# Patient Record
Sex: Female | Born: 1935 | Race: White | Hispanic: No | Smoking: Former smoker
Health system: Southern US, Community
[De-identification: ages and names within clinical notes are randomized; demographics above are authoritative.]

## PROBLEM LIST (undated history)

## (undated) DIAGNOSIS — Z950 Presence of cardiac pacemaker: Secondary | ICD-10-CM

## (undated) DIAGNOSIS — I441 Atrioventricular block, second degree: Secondary | ICD-10-CM

## (undated) DIAGNOSIS — IMO0002 Reserved for concepts with insufficient information to code with codable children: Secondary | ICD-10-CM

## (undated) DIAGNOSIS — H8110 Benign paroxysmal vertigo, unspecified ear: Secondary | ICD-10-CM

## (undated) DIAGNOSIS — E119 Type 2 diabetes mellitus without complications: Secondary | ICD-10-CM

## (undated) DIAGNOSIS — R9430 Abnormal result of cardiovascular function study, unspecified: Secondary | ICD-10-CM

## (undated) DIAGNOSIS — R0989 Other specified symptoms and signs involving the circulatory and respiratory systems: Secondary | ICD-10-CM

## (undated) DIAGNOSIS — F0390 Unspecified dementia without behavioral disturbance: Secondary | ICD-10-CM

## (undated) DIAGNOSIS — I272 Pulmonary hypertension, unspecified: Secondary | ICD-10-CM

## (undated) DIAGNOSIS — N390 Urinary tract infection, site not specified: Secondary | ICD-10-CM

## (undated) DIAGNOSIS — R0602 Shortness of breath: Secondary | ICD-10-CM

## (undated) DIAGNOSIS — I10 Essential (primary) hypertension: Secondary | ICD-10-CM

## (undated) DIAGNOSIS — Z8619 Personal history of other infectious and parasitic diseases: Secondary | ICD-10-CM

## (undated) DIAGNOSIS — F22 Delusional disorders: Secondary | ICD-10-CM

## (undated) DIAGNOSIS — R55 Syncope and collapse: Secondary | ICD-10-CM

## (undated) DIAGNOSIS — I350 Nonrheumatic aortic (valve) stenosis: Secondary | ICD-10-CM

## (undated) DIAGNOSIS — E785 Hyperlipidemia, unspecified: Secondary | ICD-10-CM

## (undated) DIAGNOSIS — L299 Pruritus, unspecified: Secondary | ICD-10-CM

## (undated) DIAGNOSIS — J449 Chronic obstructive pulmonary disease, unspecified: Secondary | ICD-10-CM

## (undated) DIAGNOSIS — I2510 Atherosclerotic heart disease of native coronary artery without angina pectoris: Secondary | ICD-10-CM

## (undated) HISTORY — DX: Abnormal result of cardiovascular function study, unspecified: R94.30

## (undated) HISTORY — DX: Hyperlipidemia, unspecified: E78.5

## (undated) HISTORY — DX: Benign paroxysmal vertigo, unspecified ear: H81.10

## (undated) HISTORY — PX: CHOLECYSTECTOMY: SHX55

## (undated) HISTORY — DX: Atherosclerotic heart disease of native coronary artery without angina pectoris: I25.10

## (undated) HISTORY — DX: Reserved for concepts with insufficient information to code with codable children: IMO0002

## (undated) HISTORY — DX: Nonrheumatic aortic (valve) stenosis: I35.0

## (undated) HISTORY — DX: Atrioventricular block, second degree: I44.1

## (undated) HISTORY — PX: ABDOMINAL HYSTERECTOMY: SHX81

## (undated) HISTORY — DX: Personal history of other infectious and parasitic diseases: Z86.19

## (undated) HISTORY — PX: EYE SURGERY: SHX253

## (undated) HISTORY — DX: Chronic obstructive pulmonary disease, unspecified: J44.9

## (undated) HISTORY — PX: APPENDECTOMY: SHX54

## (undated) HISTORY — DX: Other specified symptoms and signs involving the circulatory and respiratory systems: R09.89

## (undated) HISTORY — DX: Pulmonary hypertension, unspecified: I27.20

---

## 1979-06-03 HISTORY — PX: TOTAL KNEE ARTHROPLASTY: SHX125

## 2004-09-05 ENCOUNTER — Ambulatory Visit: Payer: Self-pay | Admitting: Cardiology

## 2004-09-07 ENCOUNTER — Ambulatory Visit: Payer: Self-pay | Admitting: Cardiology

## 2005-11-10 ENCOUNTER — Ambulatory Visit: Payer: Self-pay | Admitting: Cardiology

## 2005-12-05 ENCOUNTER — Ambulatory Visit: Payer: Self-pay | Admitting: Cardiology

## 2009-09-05 ENCOUNTER — Ambulatory Visit: Payer: Self-pay | Admitting: Cardiology

## 2009-09-05 ENCOUNTER — Encounter: Payer: Self-pay | Admitting: Cardiology

## 2009-09-06 ENCOUNTER — Encounter: Payer: Self-pay | Admitting: Cardiology

## 2009-09-07 ENCOUNTER — Encounter: Payer: Self-pay | Admitting: Cardiology

## 2009-09-09 DIAGNOSIS — R55 Syncope and collapse: Secondary | ICD-10-CM | POA: Insufficient documentation

## 2009-09-09 DIAGNOSIS — E782 Mixed hyperlipidemia: Secondary | ICD-10-CM

## 2009-09-09 DIAGNOSIS — I10 Essential (primary) hypertension: Secondary | ICD-10-CM | POA: Insufficient documentation

## 2009-09-09 DIAGNOSIS — E119 Type 2 diabetes mellitus without complications: Secondary | ICD-10-CM

## 2009-09-09 HISTORY — DX: Syncope and collapse: R55

## 2009-09-10 ENCOUNTER — Encounter: Payer: Self-pay | Admitting: Cardiology

## 2009-10-05 ENCOUNTER — Ambulatory Visit: Payer: Self-pay | Admitting: Cardiology

## 2009-10-07 ENCOUNTER — Encounter (INDEPENDENT_AMBULATORY_CARE_PROVIDER_SITE_OTHER): Payer: Self-pay | Admitting: *Deleted

## 2009-10-18 ENCOUNTER — Encounter: Payer: Self-pay | Admitting: Cardiology

## 2009-10-23 ENCOUNTER — Encounter: Payer: Self-pay | Admitting: Physician Assistant

## 2009-10-28 ENCOUNTER — Ambulatory Visit: Payer: Self-pay | Admitting: Cardiology

## 2009-10-29 ENCOUNTER — Encounter (INDEPENDENT_AMBULATORY_CARE_PROVIDER_SITE_OTHER): Payer: Self-pay | Admitting: *Deleted

## 2009-10-29 ENCOUNTER — Encounter: Payer: Self-pay | Admitting: Cardiology

## 2009-10-29 ENCOUNTER — Ambulatory Visit: Payer: Self-pay | Admitting: Cardiology

## 2009-10-29 ENCOUNTER — Inpatient Hospital Stay (HOSPITAL_COMMUNITY): Admission: EM | Admit: 2009-10-29 | Discharge: 2009-11-02 | Payer: Self-pay | Admitting: Emergency Medicine

## 2009-10-31 ENCOUNTER — Encounter: Payer: Self-pay | Admitting: Internal Medicine

## 2009-11-02 ENCOUNTER — Encounter: Payer: Self-pay | Admitting: Internal Medicine

## 2009-11-02 HISTORY — PX: INSERT / REPLACE / REMOVE PACEMAKER: SUR710

## 2009-11-04 ENCOUNTER — Encounter: Payer: Self-pay | Admitting: Internal Medicine

## 2009-11-04 ENCOUNTER — Telehealth (INDEPENDENT_AMBULATORY_CARE_PROVIDER_SITE_OTHER): Payer: Self-pay | Admitting: *Deleted

## 2009-11-08 ENCOUNTER — Encounter: Payer: Self-pay | Admitting: Internal Medicine

## 2009-11-15 ENCOUNTER — Encounter: Payer: Self-pay | Admitting: Cardiology

## 2009-11-17 ENCOUNTER — Encounter: Payer: Self-pay | Admitting: Internal Medicine

## 2009-11-17 ENCOUNTER — Ambulatory Visit: Payer: Self-pay

## 2009-11-19 ENCOUNTER — Telehealth (INDEPENDENT_AMBULATORY_CARE_PROVIDER_SITE_OTHER): Payer: Self-pay | Admitting: *Deleted

## 2009-11-30 ENCOUNTER — Encounter: Payer: Self-pay | Admitting: Cardiology

## 2009-12-08 ENCOUNTER — Encounter: Payer: Self-pay | Admitting: Internal Medicine

## 2010-01-13 ENCOUNTER — Encounter: Payer: Self-pay | Admitting: Cardiology

## 2010-02-14 ENCOUNTER — Encounter: Payer: Self-pay | Admitting: Internal Medicine

## 2010-02-25 ENCOUNTER — Ambulatory Visit: Payer: Self-pay | Admitting: Internal Medicine

## 2010-07-04 ENCOUNTER — Encounter: Payer: Self-pay | Admitting: Cardiology

## 2010-10-14 ENCOUNTER — Inpatient Hospital Stay (HOSPITAL_COMMUNITY): Admission: EM | Admit: 2010-10-14 | Discharge: 2010-10-16 | Payer: Self-pay | Source: Home / Self Care

## 2010-10-14 ENCOUNTER — Encounter: Payer: Self-pay | Admitting: Internal Medicine

## 2010-10-17 ENCOUNTER — Encounter (INDEPENDENT_AMBULATORY_CARE_PROVIDER_SITE_OTHER): Payer: Self-pay | Admitting: *Deleted

## 2010-10-17 LAB — CBC
HCT: 43.6 % (ref 36.0–46.0)
Hemoglobin: 14.4 g/dL (ref 12.0–15.0)
MCH: 29 pg (ref 26.0–34.0)
MCHC: 33 g/dL (ref 30.0–36.0)
MCV: 87.7 fL (ref 78.0–100.0)
Platelets: 216 10*3/uL (ref 150–400)
RBC: 4.97 MIL/uL (ref 3.87–5.11)
RDW: 12.9 % (ref 11.5–15.5)
WBC: 10 10*3/uL (ref 4.0–10.5)

## 2010-10-17 LAB — DIFFERENTIAL
Basophils Absolute: 0 10*3/uL (ref 0.0–0.1)
Basophils Relative: 0 % (ref 0–1)
Eosinophils Absolute: 0.3 10*3/uL (ref 0.0–0.7)
Eosinophils Relative: 3 % (ref 0–5)
Lymphocytes Relative: 18 % (ref 12–46)
Lymphs Abs: 1.8 10*3/uL (ref 0.7–4.0)
Monocytes Absolute: 1 10*3/uL (ref 0.1–1.0)
Monocytes Relative: 10 % (ref 3–12)
Neutro Abs: 6.9 10*3/uL (ref 1.7–7.7)
Neutrophils Relative %: 69 % (ref 43–77)

## 2010-10-17 LAB — URINALYSIS, ROUTINE W REFLEX MICROSCOPIC
Bilirubin Urine: NEGATIVE
Hgb urine dipstick: NEGATIVE
Ketones, ur: NEGATIVE mg/dL
Nitrite: NEGATIVE
Protein, ur: NEGATIVE mg/dL
Specific Gravity, Urine: >1.03 — ABNORMAL HIGH (ref 1.005–1.030)
Urine Glucose, Fasting: NEGATIVE mg/dL
Urobilinogen, UA: 0.2 mg/dL (ref 0.0–1.0)
pH: 5.5 (ref 5.0–8.0)

## 2010-10-17 LAB — TSH: TSH: 3.373 u[IU]/mL (ref 0.350–4.500)

## 2010-10-17 LAB — CARDIAC PANEL(CRET KIN+CKTOT+MB+TROPI)
CK, MB: 1.4 ng/mL (ref 0.3–4.0)
CK, MB: 1.3 ng/mL (ref 0.3–4.0)
CK, MB: 1.3 ng/mL (ref 0.3–4.0)
Relative Index: INVALID (ref 0.0–2.5)
Relative Index: INVALID (ref 0.0–2.5)
Relative Index: INVALID (ref 0.0–2.5)
Total CK: 40 U/L (ref 7–177)
Total CK: 39 U/L (ref 7–177)
Total CK: 30 U/L (ref 7–177)
Troponin I: 0.02 ng/mL (ref 0.00–0.06)
Troponin I: 0.01 ng/mL (ref 0.00–0.06)
Troponin I: 0.02 ng/mL (ref 0.00–0.06)

## 2010-10-17 LAB — GLUCOSE, CAPILLARY
Glucose-Capillary: 100 mg/dL — ABNORMAL HIGH (ref 70–99)
Glucose-Capillary: 186 mg/dL — ABNORMAL HIGH (ref 70–99)
Glucose-Capillary: 188 mg/dL — ABNORMAL HIGH (ref 70–99)
Glucose-Capillary: 208 mg/dL — ABNORMAL HIGH (ref 70–99)
Glucose-Capillary: 171 mg/dL — ABNORMAL HIGH (ref 70–99)
Glucose-Capillary: 215 mg/dL — ABNORMAL HIGH (ref 70–99)
Glucose-Capillary: 138 mg/dL — ABNORMAL HIGH (ref 70–99)

## 2010-10-17 LAB — BASIC METABOLIC PANEL
BUN: 19 mg/dL (ref 6–23)
CO2: 31 mEq/L (ref 19–32)
Calcium: 9.9 mg/dL (ref 8.4–10.5)
Chloride: 103 mEq/L (ref 96–112)
Creatinine, Ser: 0.96 mg/dL (ref 0.4–1.2)
GFR calc Af Amer: >60 mL/min (ref >60)
GFR calc non Af Amer: 56 mL/min — ABNORMAL LOW (ref >60)
Glucose, Bld: 170 mg/dL — ABNORMAL HIGH (ref 70–99)
Potassium: 4.2 mEq/L (ref 3.5–5.1)
Sodium: 140 mEq/L (ref 135–145)

## 2010-10-17 LAB — POCT CARDIAC MARKERS
CKMB, poc: 1.2 ng/mL (ref 1.0–8.0)
Myoglobin, poc: 85.6 ng/mL (ref 12–200)
Troponin i, poc: <0.05 ng/mL (ref 0.00–0.09)

## 2010-10-27 NOTE — H&P (Addendum)
[name], [name]               ACCOUNT NO.:  [id number]  MEDICAL RECORD NO.:  [id number]          PATIENT TYPE:  EMS  LOCATION:  ED                            FACILITY:  [facility]  PHYSICIAN:  Rock Nephew, MD       DATE OF BIRTH:  06-26-1927  DATE OF ADMISSION:  10/14/2010 DATE OF DISCHARGE:  LH                             HISTORY & PHYSICAL   PRIMARY CARE PHYSICIAN:  The patient's had a primary care physician, Dr. Raphael Gibney.  However, the patient no longer sees a primary care physician any more and she says that she does not have a primary care physician.  CHIEF COMPLAINT:  Lightheadedness, dizziness.  HISTORY OF PRESENT ILLNESS:  An 77 year old female with a past medical history of COPD not on home oxygen, history of hypertension, history of diabetes, history of benign paroxysmal positional vertigo.  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 Johney Frame.  The patient reports that she has been have feeling lightheaded.  She usually has benign paroxysmal vertigo and she walks with the walker.  She is a poor historian and cannot really tell me is the room spinning or not.  Otherwise she denies any chest pain or any shortness of breath.  She denies any stroke-like activity. She denies any seizure-like activity.  He she denies any fevers or chills.  The patient had a CT scan of the head without contrast in the emergency department showed no acute intracranial abnormalities.  The patient also a 12-lead EKG which showed left axis deviation and right bundle branch block, atrially paced.  PAST MEDICAL HISTORY:  COPD not on home oxygen, history of hypertension, diabetes, history of benign paroxysmal positional vertigo, history of hypertension, hyperlipidemia, diabetes, osteoporosis, remote history of polio, remote history of tobacco abuse, history of herpes  zoster.  ALLERGIES:  No known drug allergies.  HOME MEDICATION: 1. She takes Diovan 320 mg once a day. 2. She takes the she also reports taking four aspirins a day, 2 in the     morning and 2 in the evening.  Unclear what dose he is taking. 3. She also takes amlodipine 5 mg p.o. daily.  REVIEW OF SYSTEMS:  She denies any headaches or blurry vision.  She denies any chest pain, shortness of breath.  She denies any nausea, vomiting, abdominal pain, constipation, diarrhea, burning on urination and pain in her legs.  She reports that her dizziness is worse when she walks?.  SOCIAL HISTORY:  She is a nonsmoker, nondrinker.  No drug abuse.  She walks with a walker.  She lives home with her son, [name].  Phone number [phone].  However, he is not home very often, per her.  PHYSICAL EXAMINATION:  VITAL SIGNS:  Are as follows.  Temperature 98.0, blood pressure 163/77, pulse rate 63, respiratory rate 20, 98% saturation room air. HEAD, EYES, EARS, NOSE AND THROAT:  Normocephalic, atraumatic.  Pupils equally round, reactive to light. CARDIOVASCULAR:  S1-S2 regular rate rhythm.  No murmurs or rubs.  LUNGS:  Clear to auscultation bilaterally.  No wheezes or rhonchi. ABDOMEN:  Soft, nontender, nondistended.  Bowel sounds positive.  No guarding or rebound tenderness. EXTREMITIES:  No lower extremity edema.  Cranial nerves II-XII grossly intact.  DIAGNOSTIC STUDIES:  A 12-lead EKG shows atrial pacemaker.  Nonspecific ST and T-wave changes, mild right bundle branch block.  RADIOLOGICAL STUDIES:  She had a CT scan of the head which showed no acute intracranial abnormality, mild age-appropriate generalized atrophy and severe chronic microvascular ischemic changes of the white matter diffusely.  She had a chest x-ray which showed hyperexpansion 9 mm nodule opacity in left lower lobe which is unchanged compared to February 2011.  LABORATORY STUDIES:  WBC count is 10.0, hemoglobin is 14.4,  hematocrit is 43.6, MCV is 87.7, platelets are 216,000.  Sodium is 140, potassium 4.2, chloride 103, bicarbonate 31, BUN 19, creatinine 0.96, glucose 170, troponin-I point-of-care marker less than 0.05.  Urinalysis specific gravity 1.030, negative leukocyte esterase, negative nitrites.  IMPRESSION AND PLAN:  An 77 year old female multiple medical problems admitted for dizziness and mild lightheaded admitted because the emergency department felt unsafe discharge for the patient go home.  PROBLEM LIST: 1. Lightheadedness.  Could be of the chronic of benign paroxysmal     positional vertigo this patient has been having.  I see that her     prescription pills for meclizine have been empty.  However, the     patient will be admitted to telemetry bed.  She will have cardiac     enzymes cycled x3.  The patient will have PT/OT evaluation.  The     patient will be started on schedule meclizine. 2. Chronic obstructive pulmonary disease.  The patient will get     albuterol/Atrovent nebs. 3. History of hypertension.  The patient will be started on Diovan,     amlodipine, and p.r.n. hydralazine. 4. Type 2 diabetes mellitus.  The patient will be placed on sensitive     sliding scale.  She does not seem to take any medications for     diabetes at home. 5. History of benign paroxysmal positional vertigo.  The patient will     be against placed on 12.5 mg p.o. q.8 h scheduled meclizine. 6. History of dual-chamber pacemaker.  We will continue to monitor the     patient on telemetry . 7. Deep vein thrombosis prophylaxis.  The patient will placed on     Lovenox.  CODE STATUS:  The patient is a Full Code.  This patient will also get PT, OT evaluation during this admission.     Rock Nephew, MD     NH/MEDQ  D:  10/14/2010  T:  10/14/2010  Job:  [id number]  Electronically Signed by Rock Nephew MD on 10/27/2010 10:21:29 AM

## 2010-11-01 NOTE — Procedures (Signed)
Summary: Holter and Event/ [facility] END OF SERVICE SUMMARY REPORT  Holter and Event/ [facility] END OF SERVICE SUMMARY REPORT   Imported By: Dorise Hiss 11/30/2009 15:30:37  _____________________________________________________________________  External Attachment:    Type:   Image     Comment:   External Document

## 2010-11-01 NOTE — Letter (Signed)
Summary: Engineer, materials at [facility]  [address]   [place], [place] [address]   Phone: [phone]  Fax: [phone]        October 07, 2009 MRN: [id number]   [facility] [address] [place], [place]  [address]   Dear Ms. Leyland,  Your test ordered by Selena Batten has been reviewed by your physician (or physician assistant) and was found to be normal or stable. Your physician (or physician assistant) felt no changes were needed at this time.  _X__ Echocardiogram  ____ Cardiac Stress Test  ____ Lab Work  ____ Peripheral vascular study of arms, legs or neck  ____ CT scan or X-ray  ____ Lung or Breathing test  ____ Other:   Thank you.   Carlye Grippe LPN    Duane Boston, M.D., F.A.C.C. Thressa Sheller, M.D., F.A.C.C. Oneal Grout, M.D., F.A.C.C. Cheree Ditto, M.D., F.A.C.C. Daiva Nakayama, M.D., F.A.C.C. Kenney Houseman, M.D., F.A.C.C. Jeanne Ivan, PA-C

## 2010-11-01 NOTE — Cardiovascular Report (Signed)
Summary: [facility]/ INTERROGATION REPORT  [facility]/ INTERROGATION REPORT   Imported By: Dorise Hiss 03/03/2010 11:04:44  _____________________________________________________________________  External Attachment:    Type:   Image     Comment:   External Document

## 2010-11-01 NOTE — Assessment & Plan Note (Signed)
Summary: pc2   Visit Type:  Pacemaker check Primary Provider:  Vyas   History of Present Illness: The patient presents today for routine electrophysiology followup. She reports doing well since having her pacemaker implanted. The patient denies symptoms of palpitations, chest pain, shortness of breath, orthopnea, PND, lower extremity edema, dizziness, presyncope, syncope, or neurologic sequela. The patient is tolerating medications without difficulties and is otherwise without complaint today.   Preventive Screening-Counseling & Management  Alcohol-Tobacco     Smoking Status: quit     Year Quit: 1985  Current Medications (verified): 1)  Diovan 320 Mg Tabs (Valsartan) .... Take 1 Tablet By Mouth Once A Day 2)  Meclizine Hcl 25 Mg Tabs (Meclizine Hcl) .... Take 1/2 Tablet By Mouth Two Times A Day One One At Bedtime 3)  Lantus 100 Unit/ml Soln (Insulin Glargine) .... 30u Subcutaneously Two Times A Day 4)  Aspir-Trin 325 Mg Tbec (Aspirin) .... Take 1 Tablet By Mouth Once A Day At Bedtime As Needed  Allergies (verified): No Known Drug Allergies  Comments:  Nurse/Medical Assistant: The patient is currently on medications but does not know the name or dosage at this time. Instructed to contact our office with details. Will update medication list at that time.  Past History:  Past Medical History: History of COPD Syncope due to Mobitz II AV block, s/p PPM 11/02/09 hypertension hyperlipidemia benign positional vertigo history of shingles Diabetes mellitus Osteoporosis  Past Surgical History: Appendectomy s/p Medtronic PPM 11/02/09 by JA  Vital Signs:  Patient profile:   77 year old female Height:      63 inches Weight:      199 pounds Pulse rate:   88 / minute BP sitting:   128 / 70  (left arm) Cuff size:   large  Vitals Entered By: Carlye Grippe (Feb 25, 2010 1:55 PM)  Physical Exam  General:  Well developed, well nourished, in no acute distress. Head:  normocephalic  and atraumatic Eyes:  PERRLA/EOM intact; conjunctiva and lids normal. Mouth:  Teeth, gums and palate normal. Oral mucosa normal. Neck:  supple Chest Wall:  pacemaker pocket is well healed Lungs:  clear Heart:  RRR, no m/r/g Abdomen:  Bowel sounds positive; abdomen soft and non-tender without masses, organomegaly, or hernias noted. No hepatosplenomegaly. Pulses:  pulses normal in all 4 extremities Extremities:  No clubbing or cyanosis.  1+ BLE edema Neurologic:  Alert and oriented x 3.   PPM Specifications Following MD:  Hillis Range, MD     PPM Vendor:  Medtronic     PPM Model Number:  ADDRL1     PPM Serial Number:  [id number] PPM DOI:  11/02/2009     PPM Implanting MD:  Hillis Range, MD  Lead 1    Location: RA     DOI: 11/02/2009     Model #: 4098     Serial #: [id number]     Status: active Lead 2    Location: RV     DOI: 11/02/2009     Model #: 1948     Serial #: [id number]     Status: active  Magnet Response Rate:  BOL 85 ERI 65  Indications:  CHB   PPM Follow Up Remote Check?  No Battery Voltage:  2.80 V     Battery Est. Longevity:  14 YEARS     Pacer Dependent:  No       PPM Device Measurements Atrium  Amplitude: 2.8 mV, Impedance: 604 ohms, Threshold: 0.625 V at  0.4 msec Right Ventricle  Amplitude: 22.4 mV, Impedance: 744 ohms, Threshold: 0.75 V at 0.4 msec  Episodes MS Episodes:  89     Percent Mode Switch:  <0.1%     Coumadin:  No Ventricular High Rate:  0     Atrial Pacing:  41.2%     Ventricular Pacing:  9.8%  Parameters Mode:  MVP (R)     Lower Rate Limit:  60     Upper Rate Limit:  130 Paced AV Delay:  250     Sensed AV Delay:  220 Next Cardiology Appt Due:  10/03/2010 Tech Comments:  Normal device function.  RA and RV autocapture already on.  Longest mode switch episode 52 seconds and is frequent PAC's.  No changes made today.  ROV 1-12 JA.   Will start Carelink transmissions at that time. Gypsy Balsam RN BSN  Feb 25, 2010 2:23 PM  MD Comments:   agree  Impression & Recommendations:  Problem # 1:  SYNCOPE (ICD-780.2) s/p PPM for Mobitz II  AV block doing well no changes today  Problem # 2:  HYPERTENSION (ICD-401.9) stable  Patient Instructions: 1)  return 2/12

## 2010-11-01 NOTE — Procedures (Signed)
Summary: Holter and Event/ URGENT REPORT  Holter and Event/ URGENT REPORT   Imported By: Dorise Hiss 11/30/2009 15:33:57  _____________________________________________________________________  External Attachment:    Type:   Image     Comment:   External Document

## 2010-11-01 NOTE — Letter (Signed)
Summary: Appointment -missed  [facility] at [facility] [address]   [place], [place] [address]   Phone: [phone]  Fax: [phone]     October 18, 2009 MRN: [id number]      [facility] [address] [place], [place]  [address]      Dear Ms. Deblois,  Our records indicate you missed your appointment on October 18, 2009                        with Dr.  Andee Lineman.   It is very important that we reach you to reschedule this appointment. We look forward to participating in your health care needs.   Please contact us at the number listed above at your earliest convenience to reschedule this appointment.   Sincerely,    Glass blower/designer

## 2010-11-01 NOTE — Progress Notes (Signed)
Summary: Question regarding stool softner  Phone Note Other Incoming Call back at [phone]   Caller: Kathlene November with [facility] Summary of Call: Kathlene November, physical therapist with [facility], called regarding [name]. He sates she has not had a bowel movement since last Friday. She would like to know what she can take over-the-counter for constipation with her other medications. She recently had pacer placed at [facility]. Kathlene November can be reached at [phone] or the family can be reached at [phone]. Initial call taken by: Cyril Loosen, RN, BSN,  November 04, 2009 3:48 PM  Follow-up for Phone Call        discussed with Dr Johney Frame suggested Senokot-S.  She walked and cleared per Kathlene November but he will let the family know. Dennis Bast, RN, BSN  November 05, 2009 2:18 PM

## 2010-11-01 NOTE — Miscellaneous (Signed)
Summary: [facility] Orders   [facility] Orders   Imported By: Roderic Ovens 12/10/2009 15:14:44  _____________________________________________________________________  External Attachment:    Type:   Image     Comment:   External Document

## 2010-11-01 NOTE — Procedures (Signed)
Summary: Urgent Cardionet Report  Urgent Cardionet Report   Imported By: Cyril Loosen, RN, BSN 10/25/2009 15:10:41  _____________________________________________________________________  External Attachment:    Type:   Image     Comment:   External Document

## 2010-11-01 NOTE — Miscellaneous (Signed)
Summary: Home Care Report/ [facility]  Home Care Report/ [facility]   Imported By: Dorise Hiss 02/21/2010 10:57:15  _____________________________________________________________________  External Attachment:    Type:   Image     Comment:   External Document

## 2010-11-01 NOTE — Procedures (Signed)
Summary: wound check   Current Medications (verified): 1)  Diovan 320 Mg Tabs (Valsartan) .... Take 1 Tablet By Mouth Once A Day 2)  Meclizine Hcl 25 Mg Tabs (Meclizine Hcl) .... Take 1/2 Tablet By Mouth Two Times A Day One One At Bedtime 3)  Lantus 100 Unit/ml Soln (Insulin Glargine) .... 30u Subcutaneously Two Times A Day 4)  Aspir-Trin 325 Mg Tbec (Aspirin) .... Take 1 Tablet By Mouth Once A Day At Bedtime As Needed  Allergies (verified): No Known Drug Allergies  PPM Specifications Following MD:  Hillis Range, MD     PPM Vendor:  Medtronic     PPM Model Number:  ADDRL1     PPM Serial Number:  [id number] PPM DOI:  11/02/2009     PPM Implanting MD:  Hillis Range, MD  Lead 1    Location: RA     DOI: 11/02/2009     Model #: 4098     Serial #: [id number]     Status: active Lead 2    Location: RV     DOI: 11/02/2009     Model #: 1948     Serial #: [id number]     Status: active  Magnet Response Rate:  BOL 85 ERI 65  Indications:  CHB   PPM Follow Up Remote Check?  No Battery Voltage:  2.80 V     Battery Est. Longevity:  8 years     Pacer Dependent:  No       PPM Device Measurements Atrium  Amplitude: 2.8 mV, Impedance: 626 ohms, Threshold: 0.75 V at 0.4 msec Right Ventricle  Amplitude: 15.68 mV, Impedance: 683 ohms, Threshold: 0.75 V at 0.4 msec  Episodes MS Episodes:  10     Percent Mode Switch:  0.1%     Coumadin:  No Ventricular High Rate:  0     Atrial Pacing:  12.5%     Ventricular Pacing:  99.7%  Parameters Mode:  MVP (R)     Lower Rate Limit:  60     Upper Rate Limit:  130 Paced AV Delay:  250     Sensed AV Delay:  220 Next Cardiology Appt Due:  01/31/2010 Tech Comments:  Wound check appt.  Steri-strips removed.  Wound without redness or edema.  Pt was still wearing sling, advised this was no longer needed.  Device was implanted for Mobitz II with intermittent CHB.  Pt in NSR today with a PR interval of around .  Device reprogrammed to MVP today to allow for  intrinsic conduction and still allow shorter AV delays when V pacing is required during periods of heart block.  No other changes made.  All mode switch episodes less than 30 seconds so no EGM's available.  Pt education on wound care completed.  ROV 3 months Dr Johney Frame in [place] office. Gypsy Balsam RN BSN  November 19, 2009 2:09 PM

## 2010-11-01 NOTE — Miscellaneous (Signed)
Summary: Home Health Certification/Care Plan  Home Health Certification/Care Plan   Imported By: Roderic Ovens 12/16/2009 14:07:34  _____________________________________________________________________  External Attachment:    Type:   Image     Comment:   External Document

## 2010-11-01 NOTE — Progress Notes (Signed)
Summary: NEED VERBAL ORDER TO EXTEND SERVICES  Phone Note From Other Clinic Call back at [phone]   Caller: Micheal Call For: Dr. Jens Som Summary of Call: Physical Therapist called from [facility] requesting a verbal order that services be extended two more weeks since patient hasn't met goal yet. Nurse informed that this would be okay and to have request sent over. Initial call taken by: Carlye Grippe,  November 19, 2009 4:39 PM  Follow-up for Phone Call        ok to continue Ferman Hamming, MD, [facility]  November 19, 2009 5:23 PM

## 2010-11-01 NOTE — Cardiovascular Report (Signed)
Summary: Office Visit  Office Visit   Imported By: Roderic Ovens 11/25/2009 15:49:57  _____________________________________________________________________  External Attachment:    Type:   Image     Comment:   External Document

## 2010-11-01 NOTE — Letter (Signed)
Summary: [facility] D/C SUMMARY DR.VYAS  [facility] D/C SUMMARY DR.VYAS   Imported By: Zachary George 10/04/2009 14:33:16  _____________________________________________________________________  External Attachment:    Type:   Image     Comment:   External Document

## 2010-11-01 NOTE — Miscellaneous (Signed)
Summary: Device preload  Clinical Lists Changes  Observations: Added new observation of PPM INDICATN: CHB (11/08/2009 14:59) Added new observation of MAGNET RTE: BOL 85 ERI 65 (11/08/2009 14:59) Added new observation of PPMLEADSTAT2: active (11/08/2009 14:59) Added new observation of PPMLEADSER2: [id number] (11/08/2009 14:59) Added new observation of PPMLEADMOD2: 1948  (11/08/2009 14:59) Added new observation of PPMLEADLOC2: RV  (11/08/2009 14:59) Added new observation of PPMLEADSTAT1: active  (11/08/2009 14:59) Added new observation of PPMLEADSER1: [id number]  (11/08/2009 14:59) Added new observation of PPMLEADMOD1: 5076  (11/08/2009 14:59) Added new observation of PPMLEADLOC1: RA  (11/08/2009 14:59) Added new observation of PPM IMP MD: Hillis Range, MD  (11/08/2009 14:59) Added new observation of PPMLEADDOI2: 11/02/2009  (11/08/2009 14:59) Added new observation of PPMLEADDOI1: 11/02/2009  (11/08/2009 14:59) Added new observation of PPM DOI: 11/02/2009  (11/08/2009 14:59) Added new observation of PPM SERL#: [id number]  (11/08/2009 14:59) Added new observation of PPM MODL#: ADDRL1  (11/08/2009 95:63) Added new observation of PACEMAKERMFG: Medtronic  (11/08/2009 14:59) Added new observation of PACEMAKER MD: Hillis Range, MD  (11/08/2009 14:59)      PPM Specifications Following MD:  Hillis Range, MD     PPM Vendor:  Medtronic     PPM Model Number:  ADDRL1     PPM Serial Number:  [id number] PPM DOI:  11/02/2009     PPM Implanting MD:  Hillis Range, MD  Lead 1    Location: RA     DOI: 11/02/2009     Model #: 5188     Serial #: [id number]     Status: active Lead 2    Location: RV     DOI: 11/02/2009     Model #: 1948     Serial #: [id number]     Status: active  Magnet Response Rate:  BOL 85 ERI 65  Indications:  CHB

## 2010-11-01 NOTE — Letter (Signed)
Summary: ER Notification  Architectural technologist at [facility]. [address]   [place], [place] [address]   Phone: [phone]  Fax: [phone]    October 29, 2009 4:33 PM  [facility]  The above referenced patient has been advised to report directly to the Emergency Room. Please see below for more information:  Dx: __Heart Block with pauses, h/o syncope__________     Private Vehicle  _______________ or EMS:  ________________   Orders:  Yes ______ or No  _______   Notify upon arrival:     Trish [phone]) [phone] (at [facility])       Or _________________   Thank you,    [facility] Staff   Pt aware to go to ER ASAP for eval. Pt is trying to arrange transportation to [facility]. If she cannot arrange transportation to [facility], she will call EMS and most likely go to [facility] ER for initial evaluation.

## 2010-11-01 NOTE — Assessment & Plan Note (Signed)
Summary: PER DR.KATZ   Visit Type:  hospital Follow-up Primary Provider:  Vyas  CC:  hospital follow-up visit.  History of Present Illness: Pleasant 77 year old female recently admitted to that hospital with a syncopal episode. This was felt to be secondary to orthostasis. Her triamterene hydrochlorothiazide was discontinued. Note her enzymes were negative. An electrocardiogram apparently showed sinus rhythm with first degree AV block, right bundle branch block and left anterior fascicular block. An echocardiogram on September 10, 2009 revealed normal LV function, moderate LVH. It was interpreted as very mild aortic stenosis with a mean gradient was 6 mmHg not consistent with significant aortic stenosis.  A Myoview was performed on December 10 that showed an ejection fraction of 75%. There was inferior attenuation but no ischemia. Since she was discharged she continues to complain of occasional dizzy spells. She states that these can occur when standing up and improved with sitting down. However they also occur while sitting and lying. She will suddenly feel lightheaded like she is going to pass out. This lasts for seconds to minutes and resolve spontaneously. There is no associated chest pain or shortness of breath. She's had no syncopal episodes and since she went home. She does occasionally feel a sharp pain in her chest that lasts seconds. However she does not have exertional chest pain.  Preventive Screening-Counseling & Management  Alcohol-Tobacco     Smoking Status: quit     Year Quit: 1972  Current Medications (verified): 1)  Diovan 320 Mg Tabs (Valsartan) .... Take 1 Tablet By Mouth Once A Day 2)  Meclizine Hcl 25 Mg Tabs (Meclizine Hcl) .... Take 1/2 Tablet By Mouth Two Times A Day One One At Bedtime 3)  Lantus 100 Unit/ml Soln (Insulin Glargine) .... 30u Subcutaneously Two Times A Day 4)  Aspir-Trin 325 Mg Tbec (Aspirin) .... Take 1 Tablet By Mouth Once A Day At Bedtime As  Needed  Allergies (verified): No Known Drug Allergies  Comments:  Nurse/Medical Assistant: The patient's medications and allergies were reviewed with the patient and were updated in the Medication and Allergy Lists. Patient brought bottles to office.  Past History:  Past Medical History: History of COPD Syncope hypertension hyperlipidemia benign positional vertigo history of shingles Diabetes mellitus Osteoporosis  Past Surgical History: Appendectomy  Social History: Reviewed history from 09/09/2009 and no changes required. Retired  Alcohol Use - no Drug Use - no former history of smoking approx. for 30 years Smoking Status:  quit  Review of Systems       Problems with dizziness and fatigue but no fevers or chills, productive cough, hemoptysis, dysphasia, odynophagia, melena, hematochezia, dysuria, hematuria, rash, seizure activity, orthopnea, PND, pedal edema, claudication. Remaining systems are negative.   Vital Signs:  Patient profile:   77 year old female Height:      63 inches Weight:      194 pounds BMI:     34.49 O2 Sat:      95 % Pulse rate:   77 / minute BP sitting:   173 / 79  (left arm) Cuff size:   large  Vitals Entered By: Carlye Grippe (October 05, 2009 12:49 PM)  Nutrition Counseling: Patient's BMI is greater than 25 and therefore counseled on weight management options. CC: hospital follow-up visit   Physical Exam  General:  Well-developed well-nourished in no acute distress.  Skin is warm and dry.  HEENT is normal.  Neck is supple. No thyromegaly.  Chest is clear to auscultation with normal expansion.  Cardiovascular exam is regular rate and rhythm. 1/6 systolic murmur left sternal border. Abdominal exam nontender or distended. No masses palpated. Extremities show no edema. neuro grossly intact    Impression & Recommendations:  Problem # 1:  SYNCOPE (ICD-780.2)  The patient continues to have occasional dizzy spells. Some of her  symptoms sound to be orthostatic mediated as they occur with standing and resolve with sitting down. However she has transient episodes while sitting and lying. She also has a trifascicular block. I am concerned about the possibility of bradycardia mediated events. I will schedule an event monitor to more fully evaluate. She may require a pacemaker if indeed she has bradycardia associated with these episodes.  Note her LV function is normal by echo and her Myoview showed no ischemia. Note we did check orthostatics in the office. In the lying position her blood pressure was 172/92 with a pulse of 69. In the standing position she was 168/89 with a pulse of 89. She was therefore orthostatic with pulse only. Her updated medication list for this problem includes:    Aspir-trin 325 Mg Tbec (Aspirin) .Marland Kitchen... Take 1 tablet by mouth once a day at bedtime as needed  Orders: Cardionet/Event Monitor (Cardionet/Event)  Problem # 2:  HYPERTENSION (ICD-401.9) Her blood pressure is elevated today. However I am hesitant to add additional medications at this point as some of her symptoms sound orthostatic mediated. If her blood pressure remains elevated in the future and will most likely add a low dose calcium blocker such as Norvasc. I would like to avoid diuretics if possible given the orthostatic nature of her symptoms. Her updated medication list for this problem includes:    Diovan 320 Mg Tabs (Valsartan) .Marland Kitchen... Take 1 tablet by mouth once a day    Aspir-trin 325 Mg Tbec (Aspirin) .Marland Kitchen... Take 1 tablet by mouth once a day at bedtime as needed  Problem # 3:  CHEST PAIN (ICD-786.50) She has vague atypical chest pain. Recent Myoview without ischemia. No further workup at this point. Her updated medication list for this problem includes:    Aspir-trin 325 Mg Tbec (Aspirin) .Marland Kitchen... Take 1 tablet by mouth once a day at bedtime as needed  Problem # 4:  DM (ICD-250.00) Management per primary care. Her updated medication  list for this problem includes:    Diovan 320 Mg Tabs (Valsartan) .Marland Kitchen... Take 1 tablet by mouth once a day    Lantus 100 Unit/ml Soln (Insulin glargine) .Marland KitchenMarland KitchenMarland KitchenMarland Kitchen 30u subcutaneously two times a day    Aspir-trin 325 Mg Tbec (Aspirin) .Marland Kitchen... Take 1 tablet by mouth once a day at bedtime as needed  Problem # 5:  MIXED HYPERLIPIDEMIA (ICD-272.2) Management per primary care.  Patient Instructions: 1)  Your physician has recommended that you wear an event monitor.  Event monitors are medical devices that record the heart's electrical activity. Doctors most often use these monitors to diagnose arrhythmias. Arrhythmias are problems with the speed or rhythm of the heartbeat. The monitor is a small, portable device. You can wear one while you do your normal daily activities. This is usually used to diagnose what is causing palpitations/syncope (passing out). 2)  FOLLOW UP APPT WITH GENE SERPE, PA-C ON MONDAY, JANUARY 17TH AT 12:45PM.

## 2010-11-01 NOTE — Miscellaneous (Signed)
Summary: Home Health Certification/Care Plan   Home Health Certification/Care Plan   Imported By: Roderic Ovens 12/17/2009 10:44:47  _____________________________________________________________________  External Attachment:    Type:   Image     Comment:   External Document

## 2010-11-01 NOTE — Letter (Signed)
Summary: Appointment- Rescheduled  [facility] [address]   [place], [place] [address]   Phone: [phone]  Fax: [phone]     Feb 14, 2010 MRN: [id number]     [facility] [address] [place], [place]  [address]     Dear Ms. Topham,   Due to a change in our office schedule, your appointment on   MAY 20th, 2011 at 1:45 must be changed.    Your new appointment will be May 27th, 2011 at 1:45pm.  We look forward to participating in your health care needs.      Sincerely,  Glass blower/designer

## 2010-11-03 NOTE — Letter (Signed)
Summary: Appointment - Reminder 2  [facility], Main Office  [address]   [place], [place] [address]   Phone: [phone]  Fax: [phone]     October 17, 2010 MRN: [id number]   [facility] [address] [place], [place]  [address]   Dear Ms. Finamore,  Our records indicate that you need a 1 week hospital follow up appointment with Dr. Johney Frame. It is very important that we reach you to schedule this appointment. We look forward to participating in your health care needs. Please contact us at the number listed above at your earliest convenience to schedule your appointment.  If you are unable to make an appointment at this time, give [place] a call so we can update our records.     Sincerely,   Glass blower/designer

## 2010-11-17 NOTE — Letter (Signed)
Summary: [facility] Discharge Summary  [facility] Discharge Summary   Imported By: Earl Many 11/09/2010 16:59:51  _____________________________________________________________________  External Attachment:    Type:   Image     Comment:   External Document

## 2010-11-28 ENCOUNTER — Other Ambulatory Visit (HOSPITAL_COMMUNITY): Payer: Self-pay | Admitting: Family Medicine

## 2010-11-28 DIAGNOSIS — Z139 Encounter for screening, unspecified: Secondary | ICD-10-CM

## 2010-12-12 ENCOUNTER — Ambulatory Visit (HOSPITAL_COMMUNITY)
Admission: RE | Admit: 2010-12-12 | Discharge: 2010-12-12 | Disposition: A | Discharge status: Home / Self Care | Payer: Medicare Other | Source: Ambulatory Visit | Attending: Family Medicine | Admitting: Family Medicine

## 2010-12-12 DIAGNOSIS — Z1231 Encounter for screening mammogram for malignant neoplasm of breast: Secondary | ICD-10-CM | POA: Insufficient documentation

## 2010-12-12 DIAGNOSIS — Z139 Encounter for screening, unspecified: Secondary | ICD-10-CM

## 2010-12-19 LAB — GLUCOSE, CAPILLARY
Glucose-Capillary: 111 mg/dL — ABNORMAL HIGH (ref 70–99)
Glucose-Capillary: 119 mg/dL — ABNORMAL HIGH (ref 70–99)
Glucose-Capillary: 125 mg/dL — ABNORMAL HIGH (ref 70–99)
Glucose-Capillary: 144 mg/dL — ABNORMAL HIGH (ref 70–99)
Glucose-Capillary: 147 mg/dL — ABNORMAL HIGH (ref 70–99)
Glucose-Capillary: 168 mg/dL — ABNORMAL HIGH (ref 70–99)
Glucose-Capillary: 196 mg/dL — ABNORMAL HIGH (ref 70–99)

## 2010-12-19 LAB — CBC
Hemoglobin: 13.6 g/dL (ref 12.0–15.0)
MCHC: 33 g/dL (ref 30.0–36.0)
MCV: 89.5 fL (ref 78.0–100.0)
RBC: 4.59 MIL/uL (ref 3.87–5.11)

## 2010-12-19 LAB — BASIC METABOLIC PANEL
CO2: 30 mEq/L (ref 19–32)
CO2: 30 mEq/L (ref 19–32)
Chloride: 106 mEq/L (ref 96–112)
Chloride: 99 mEq/L (ref 96–112)
GFR calc Af Amer: >60 mL/min (ref >60)
Glucose, Bld: 136 mg/dL — ABNORMAL HIGH (ref 70–99)
Potassium: 4.2 mEq/L (ref 3.5–5.1)
Sodium: 136 mEq/L (ref 135–145)
Sodium: 140 mEq/L (ref 135–145)

## 2010-12-19 LAB — COMPREHENSIVE METABOLIC PANEL
ALT: 17 U/L (ref 0–35)
AST: 16 U/L (ref 0–37)
Albumin: 3.4 g/dL — ABNORMAL LOW (ref 3.5–5.2)
CO2: 29 mEq/L (ref 19–32)
Calcium: 9.7 mg/dL (ref 8.4–10.5)
GFR calc Af Amer: >60 mL/min (ref >60)
GFR calc non Af Amer: >60 mL/min (ref >60)
Sodium: 139 mEq/L (ref 135–145)

## 2010-12-19 LAB — DIFFERENTIAL
Basophils Relative: 1 % (ref 0–1)
Eosinophils Absolute: 0.4 10*3/uL (ref 0.0–0.7)
Monocytes Absolute: 0.9 10*3/uL (ref 0.1–1.0)
Monocytes Relative: 9 % (ref 3–12)

## 2010-12-19 LAB — POCT I-STAT, CHEM 8
Calcium, Ion: 1.27 mmol/L (ref 1.12–1.32)
Chloride: 108 mEq/L (ref 96–112)
Glucose, Bld: 186 mg/dL — ABNORMAL HIGH (ref 70–99)
HCT: 41 % (ref 36.0–46.0)
Hemoglobin: 13.9 g/dL (ref 12.0–15.0)

## 2010-12-19 LAB — TSH: TSH: 3.347 u[IU]/mL (ref 0.350–4.500)

## 2010-12-19 LAB — POCT CARDIAC MARKERS: Troponin i, poc: <0.05 ng/mL (ref 0.00–0.09)

## 2010-12-22 LAB — GLUCOSE, CAPILLARY
Glucose-Capillary: 193 mg/dL — ABNORMAL HIGH (ref 70–99)
Glucose-Capillary: 267 mg/dL — ABNORMAL HIGH (ref 70–99)

## 2011-03-30 ENCOUNTER — Encounter: Payer: Self-pay | Admitting: Internal Medicine

## 2011-05-01 ENCOUNTER — Emergency Department (HOSPITAL_COMMUNITY): Payer: Medicare Other

## 2011-05-01 ENCOUNTER — Other Ambulatory Visit: Payer: Self-pay

## 2011-05-01 ENCOUNTER — Encounter (HOSPITAL_COMMUNITY): Payer: Self-pay | Admitting: *Deleted

## 2011-05-01 ENCOUNTER — Inpatient Hospital Stay (HOSPITAL_COMMUNITY)
Admission: EM | Admit: 2011-05-01 | Discharge: 2011-05-04 | DRG: 312 | Disposition: A | Discharge status: Home Health | Payer: Medicare Other | Attending: Family Medicine | Admitting: Family Medicine

## 2011-05-01 DIAGNOSIS — F0390 Unspecified dementia without behavioral disturbance: Secondary | ICD-10-CM | POA: Diagnosis present

## 2011-05-01 DIAGNOSIS — Z950 Presence of cardiac pacemaker: Secondary | ICD-10-CM

## 2011-05-01 DIAGNOSIS — S20229A Contusion of unspecified back wall of thorax, initial encounter: Secondary | ICD-10-CM | POA: Diagnosis present

## 2011-05-01 DIAGNOSIS — I10 Essential (primary) hypertension: Secondary | ICD-10-CM | POA: Diagnosis present

## 2011-05-01 DIAGNOSIS — A498 Other bacterial infections of unspecified site: Secondary | ICD-10-CM | POA: Diagnosis present

## 2011-05-01 DIAGNOSIS — M47817 Spondylosis without myelopathy or radiculopathy, lumbosacral region: Secondary | ICD-10-CM | POA: Diagnosis present

## 2011-05-01 DIAGNOSIS — E119 Type 2 diabetes mellitus without complications: Secondary | ICD-10-CM | POA: Diagnosis present

## 2011-05-01 DIAGNOSIS — E785 Hyperlipidemia, unspecified: Secondary | ICD-10-CM | POA: Diagnosis present

## 2011-05-01 DIAGNOSIS — I2510 Atherosclerotic heart disease of native coronary artery without angina pectoris: Secondary | ICD-10-CM | POA: Diagnosis present

## 2011-05-01 DIAGNOSIS — W19XXXA Unspecified fall, initial encounter: Secondary | ICD-10-CM | POA: Diagnosis present

## 2011-05-01 DIAGNOSIS — R55 Syncope and collapse: Principal | ICD-10-CM | POA: Diagnosis present

## 2011-05-01 DIAGNOSIS — N390 Urinary tract infection, site not specified: Secondary | ICD-10-CM | POA: Diagnosis present

## 2011-05-01 HISTORY — DX: Presence of cardiac pacemaker: Z95.0

## 2011-05-01 LAB — URINALYSIS, ROUTINE W REFLEX MICROSCOPIC
Bilirubin Urine: NEGATIVE
Ketones, ur: NEGATIVE mg/dL
Nitrite: NEGATIVE
Protein, ur: 30 mg/dL — AB
pH: 6.5 (ref 5.0–8.0)

## 2011-05-01 LAB — GLUCOSE, CAPILLARY

## 2011-05-01 LAB — URINE MICROSCOPIC-ADD ON

## 2011-05-01 LAB — DIFFERENTIAL
Basophils Absolute: 0.1 10*3/uL (ref 0.0–0.1)
Basophils Relative: 1 % (ref 0–1)
Eosinophils Absolute: 0.3 10*3/uL (ref 0.0–0.7)
Monocytes Absolute: 0.8 10*3/uL (ref 0.1–1.0)
Monocytes Relative: 8 % (ref 3–12)
Neutrophils Relative %: 73 % (ref 43–77)

## 2011-05-01 LAB — CBC
Hemoglobin: 15.6 g/dL — ABNORMAL HIGH (ref 12.0–15.0)
MCH: 28.2 pg (ref 26.0–34.0)
MCHC: 32.6 g/dL (ref 30.0–36.0)
RDW: 13 % (ref 11.5–15.5)

## 2011-05-01 LAB — BASIC METABOLIC PANEL
BUN: 14 mg/dL (ref 6–23)
Creatinine, Ser: 0.74 mg/dL (ref 0.50–1.10)
GFR calc Af Amer: >60 mL/min (ref >60)
GFR calc non Af Amer: >60 mL/min (ref >60)
Potassium: 4.2 mEq/L (ref 3.5–5.1)

## 2011-05-01 LAB — CARDIAC PANEL(CRET KIN+CKTOT+MB+TROPI)
CK, MB: 2.5 ng/mL (ref 0.3–4.0)
Total CK: 60 U/L (ref 7–177)

## 2011-05-01 MED ORDER — INSULIN GLARGINE 100 UNIT/ML ~~LOC~~ SOLN
30.0000 [IU] | Freq: Two times a day (BID) | SUBCUTANEOUS | Status: DC
  Administered 2011-05-01 – 2011-05-04 (×6): 30 [IU] via SUBCUTANEOUS
  Filled 2011-05-01 (×2): qty 3

## 2011-05-01 MED ORDER — DEXTROSE 5 % IV SOLN
INTRAVENOUS | Status: AC
  Filled 2011-05-01: qty 1

## 2011-05-01 MED ORDER — SODIUM CHLORIDE 0.45 % IV SOLN
INTRAVENOUS | Status: DC
  Administered 2011-05-01: 18:00:00 via INTRAVENOUS
  Administered 2011-05-03: 1000 mL via INTRAVENOUS
  Administered 2011-05-04: 07:00:00 via INTRAVENOUS

## 2011-05-01 MED ORDER — DEXTROSE 5 % IV SOLN
1.0000 g | INTRAVENOUS | Status: DC
  Administered 2011-05-01: 13:00:00 via INTRAVENOUS
  Administered 2011-05-02 – 2011-05-03 (×2): 1 g via INTRAVENOUS
  Filled 2011-05-01 (×4): qty 1

## 2011-05-01 MED ORDER — ENOXAPARIN SODIUM 40 MG/0.4ML ~~LOC~~ SOLN
40.0000 mg | SUBCUTANEOUS | Status: DC
  Administered 2011-05-01 – 2011-05-03 (×3): 40 mg via SUBCUTANEOUS
  Filled 2011-05-01 (×2): qty 0.4

## 2011-05-01 MED ORDER — ONDANSETRON HCL 4 MG/2ML IJ SOLN
4.0000 mg | Freq: Four times a day (QID) | INTRAMUSCULAR | Status: DC | PRN
  Administered 2011-05-01 – 2011-05-03 (×2): 4 mg via INTRAVENOUS
  Filled 2011-05-01 (×2): qty 2

## 2011-05-01 MED ORDER — HYDROMORPHONE HCL 1 MG/ML IJ SOLN
1.0000 mg | Freq: Once | INTRAMUSCULAR | Status: AC
  Administered 2011-05-01: 1 mg via INTRAVENOUS
  Filled 2011-05-01: qty 1

## 2011-05-01 MED ORDER — SODIUM CHLORIDE 0.9 % IV SOLN: INTRAVENOUS | Status: DC

## 2011-05-01 MED ORDER — DEXTROSE 5 % IV SOLN: 1.0000 g | Freq: Once | INTRAVENOUS | Status: DC

## 2011-05-01 NOTE — ED Provider Notes (Addendum)
History    Scribed for Shelda Jakes, MD, the patient was seen in room APA02/APA02. This chart was scribed by Clarita Crane. This patient's care was started at 12:03 PM.  Chief Complaint  Patient presents with  . Syncopal episode    Patient is a 77 y.o. female presenting with syncope. The history is provided by the patient. No language interpreter was used.  Loss of Consciousness This is a recurrent problem. Episode onset: this morning. The problem has been resolved. Pertinent negatives include no chest pain, no abdominal pain, no headaches and no shortness of breath. The symptoms are aggravated by nothing. The symptoms are relieved by nothing. She has tried nothing for the symptoms.  Patient is an 77 year old female with an apparent syncopal episode this morning while at home alone and now complains of pain to gluteal region. Denies having chest pain, SOB, abdominal pain, fever, nausea or vomiting prior to and after episode.  Notes she was unable to get up from the ground after awaking from the episode due to the pain in her gluteal region. Patient is unsure whether she sustained a head injury with fall during syncopal episode. States she called EMS after awaking but EMS did not answer. Reports she had a syncopal episode 3 days ago while resting at home but was not evaluated after the episode. Patient with h/o pacemaker inserted 2 years ago but is unsure of the brand inserted.   Cardiologist-[facility] PCP-[facility]  PAST MEDICAL HISTORY:  Past Medical History  Diagnosis Date  . COPD (chronic obstructive pulmonary disease)   . Syncope and collapse     Due to Mobitz II AV block, s/p PPM 11/02/09  . Hypertension   . Hyperlipidemia   . Benign positional vertigo   . History of shingles   . Diabetes mellitus   . Osteoporosis   . Pacemaker     PAST SURGICAL HISTORY:  Past Surgical History  Procedure Date  . Appendectomy   . Insert / replace / remove pacemaker 11/02/09    S/p  [employer] PPM by JA  . Cholecystectomy   . Abdominal hysterectomy     MEDICATIONS:  Previous Medications   ASPIRIN EC 325 MG TABLET    Take 325 mg by mouth as needed. For pain   CIPROFLOXACIN (CIPRO) 500 MG TABLET    Take 500 mg by mouth 2 (two) times daily. Last dose would have been today    INSULIN GLARGINE (LANTUS) 100 UNIT/ML INJECTION    Inject 30 Units into the skin 2 (two) times daily.     MECLIZINE (ANTIVERT) 25 MG TABLET    Take 12.5-25 mg by mouth 3 (three) times daily. Takes 12.5 mg twice a day and takes 25 mg at bedtime.   VALSARTAN (DIOVAN) 320 MG TABLET    Take 320 mg by mouth daily.       ALLERGIES:  Allergies as of 05/01/2011  . (No Known Allergies)     FAMILY HISTORY:  Family History  Problem Relation Age of Onset  . Cancer Mother     Stomach  . Cancer Father     Stomach  . Heart disease Neg Hx     Nor sudden cardiac death  . Diabetes Other     Several family members     SOCIAL HISTORY: History   Social History  . Marital Status: Single    Spouse Name: N/A    Number of Children: N/A  . Years of Education: N/A  Occupational History  . Retired    Social History Main Topics  . Smoking status: Former Smoker -- 30 years  . Smokeless tobacco: None  . Alcohol Use: No  . Drug Use: No  . Sexually Active:    Other Topics Concern  . None   Social History Narrative  . None     Review of Systems  Constitutional: Negative for fever and chills.  HENT: Negative for rhinorrhea.   Eyes: Negative for pain.  Respiratory: Negative for cough and shortness of breath.   Cardiovascular: Positive for syncope. Negative for chest pain.  Gastrointestinal: Negative for nausea, vomiting, abdominal pain and diarrhea.  Genitourinary: Negative for dysuria.  Musculoskeletal: Negative for back pain.       Gluteal pain/soreness  Skin: Negative for rash.  Neurological: Positive for syncope. Negative for weakness and headaches.    Physical Exam  BP 209/86  Pulse  74  Temp(Src) 98 F (36.7 C) (Oral)  Resp 23  Ht 5\' 5"  (1.651 m)  Wt 190 lb (86.183 kg)  BMI 31.62 kg/m2  SpO2 96%  Physical Exam  Nursing note and vitals reviewed. Constitutional: She is oriented to person, place, and time. She appears well-developed and well-nourished. No distress.       Hypertensive.  HENT:  Head: Normocephalic and atraumatic.  Mouth/Throat: No oropharyngeal exudate.       No signs of tongue biting or trauma.  Eyes: Conjunctivae and EOM are normal. Pupils are equal, round, and reactive to light. No scleral icterus.  Neck: Neck supple.  Cardiovascular: Normal rate and regular rhythm.  Exam reveals no gallop and no friction rub.   No murmur heard. Pulmonary/Chest: Effort normal and breath sounds normal. She has no wheezes.  Abdominal: Soft. Bowel sounds are normal. She exhibits no distension. There is no tenderness.  Musculoskeletal: Normal range of motion. She exhibits no edema and no tenderness.       Notable atrophy of RLE muscles which patient states is due to Polio.   Neurological: She is alert and oriented to person, place, and time. No cranial nerve deficit or sensory deficit.  Skin: Skin is warm and dry.  Psychiatric: She has a normal mood and affect. Her behavior is normal.    ED Course  Procedures  OTHER DATA REVIEWED: Nursing notes, vital signs, and past medical records reviewed.    DIAGNOSTIC STUDIES:   LABS / RADIOLOGY: Results for orders placed during the hospital encounter of 05/01/11  URINALYSIS, ROUTINE W REFLEX MICROSCOPIC      Component Value Range   Color, Urine YELLOW  YELLOW    Appearance HAZY (*) CLEAR    Specific Gravity, Urine 1.015  1.005 - 1.030    pH 6.5  5.0 - 8.0    Glucose, UA 500 (*) NEGATIVE (mg/dL)   Hgb urine dipstick SMALL (*) NEGATIVE    Bilirubin Urine NEGATIVE  NEGATIVE    Ketones, ur NEGATIVE  NEGATIVE (mg/dL)   Protein, ur 30 (*) NEGATIVE (mg/dL)   Urobilinogen, UA 0.2  0.0 - 1.0 (mg/dL)   Nitrite  NEGATIVE  NEGATIVE    Leukocytes, UA SMALL (*) NEGATIVE   CBC      Component Value Range   WBC 9.4  4.0 - 10.5 (K/uL)   RBC 5.54 (*) 3.87 - 5.11 (MIL/uL)   Hemoglobin 15.6 (*) 12.0 - 15.0 (g/dL)   HCT 16.1 (*) 09.6 - 46.0 (%)   MCV 86.3  78.0 - 100.0 (fL)   MCH 28.2  26.0 -  34.0 (pg)   MCHC 32.6  30.0 - 36.0 (g/dL)   RDW 40.9  81.1 - 91.4 (%)   Platelets 222  150 - 400 (K/uL)  DIFFERENTIAL      Component Value Range   Neutrophils Relative 73  43 - 77 (%)   Neutro Abs 6.8  1.7 - 7.7 (K/uL)   Lymphocytes Relative 16  12 - 46 (%)   Lymphs Abs 1.5  0.7 - 4.0 (K/uL)   Monocytes Relative 8  3 - 12 (%)   Monocytes Absolute 0.8  0.1 - 1.0 (K/uL)   Eosinophils Relative 3  0 - 5 (%)   Eosinophils Absolute 0.3  0.0 - 0.7 (K/uL)   Basophils Relative 1  0 - 1 (%)   Basophils Absolute 0.1  0.0 - 0.1 (K/uL)  BASIC METABOLIC PANEL      Component Value Range   Sodium 135  135 - 145 (mEq/L)   Potassium 4.2  3.5 - 5.1 (mEq/L)   Chloride 99  96 - 112 (mEq/L)   CO2 29  19 - 32 (mEq/L)   Glucose, Bld 264 (*) 70 - 99 (mg/dL)   BUN 14  6 - 23 (mg/dL)   Creatinine, Ser 7.82  0.50 - 1.10 (mg/dL)   Calcium 95.6  8.4 - 10.5 (mg/dL)   GFR calc non Af Amer >60  >60 (mL/min)   GFR calc Af Amer >60  >60 (mL/min)  CK TOTAL AND CKMB      Component Value Range   Total CK 82  7 - 177 (U/L)   CK, MB 3.0  0.3 - 4.0 (ng/mL)   Relative Index RELATIVE INDEX IS INVALID  0.0 - 2.5   TROPONIN I      Component Value Range   Troponin I <0.30  <0.30 (ng/mL)  URINE MICROSCOPIC-ADD ON      Component Value Range   Squamous Epithelial / LPF FEW (*) RARE    WBC, UA TOO NUMEROUS TO COUNT  <3 (WBC/hpf)   RBC / HPF 7-10  <3 (RBC/hpf)   Bacteria, UA MANY (*) RARE    Dg Hip Bilateral W/pelvis  05/01/2011  *RADIOLOGY REPORT*  Clinical Data: Bilateral hip pain, greater on the right.  Fell this morning.  BILATERAL HIP WITH PELVIS - 4+ VIEW  Comparison: None.  Findings: Diffuse osteopenia.  No fracture or dislocation seen.  Lower lumbar spine degenerative changes.  IMPRESSION: No acute abnormality.  Original Report Authenticated By: Darrol Angel, M.D.   Ct Lumbar Spine Wo Contrast  05/01/2011  *RADIOLOGY REPORT*  Clinical Data: Fall.  Back pain.  CT LUMBAR SPINE WITHOUT CONTRAST  Technique:  Multidetector CT imaging of the lumbar spine was performed without intravenous contrast administration. Multiplanar CT image reconstructions were also generated.  Comparison: None.  Findings: Last fully open disc space is labeled L5-S1.  Present examination incorporates from T12-L1 disc space through the mid to upper sacrum.  Bilateral adrenal gland hyperplasia.  Calcified ectatic aorta. Calcified aortic branch vessels.  No discrete fracture noted.  T12-L1:  Small Schmorl's node deformity.  Mild bulge.  L1-2:  Minimal retrolisthesis L1.  Minimal bulge.  L2-3:  Facet joint degenerative changes.  Bulge slightly greater to the right.  Mild spinal stenosis slightly greater on the right.  L3-4:  Prominent disc degeneration with vacuum disc. Bulge/osteophyte greater right foraminal position encroaching upon the course of the exiting right L3 nerve root.  Facet joint degenerative changes greater on the right.  Mild to slightly  moderate spinal stenosis.  L4-5:  Bulge with greatest extension left foraminal position without compression of the exiting L4 nerve root.  Facet joint degenerative changes.  Mild spinal stenosis.  L5-S1:  Moderate facet joint degenerative changes.  Minimal bulge.  IMPRESSION: Degenerative changes most notable L3-4 as discussed above.  No discrete fracture detected.  Atherosclerotic type changes aorta and branch vessels.  Original Report Authenticated By: Fuller Canada, M.D.   Dg Chest Portable 1 View  05/01/2011  *RADIOLOGY REPORT*  Clinical Data: Syncope.  Pacemaker.  Chest pain.  Previous smoker.  PORTABLE CHEST - 1 VIEW  Comparison: 10/14/2010  Findings: Normal heart size and pulmonary vascularity.  Probable  emphysematous changes in the upper lungs.  Cardiac pacemaker with lead tips unchanged in position.  Calcified and tortuous aorta.  No focal airspace consolidation in the lungs.  No blunting of costophrenic angles.  No pneumothorax.  No significant changes since the previous study.  Stable appearance of vague left mid lung pulmonary nodule.  IMPRESSION: No evidence of active pulmonary disease.  Stable appearance of the chest since previous study.  Original Report Authenticated By: Marlon Pel, M.D.    PROCEDURES:  ED COURSE / COORDINATION OF CARE: 3:20Pm- Patient informed of information obtained through interrogation of [employer] pacemaker. Patient reports she lives at home with her son but her son is seldomly there due to his work schedule. Patient informed of intent to admit and agrees to be admitted at this time. 3:30PM- Consult completed with Dr. Renard Matter. Patient case discussed with Dr. Renard Matter regarding admission. Dr. Renard Matter agrees to admit patient to hospital.   MDM: Differential Diagnosis:  SYNCOPE BUT MONTIORING IN ED AND INTERIGATION OF PACE MAKER NEGATIVE. CARDIAC MARKERS NEGATIVE AND CT BACK AND XRYA HIPS NEGAITVE. UTI TX WITH CIPRO NOT CLEAR FROM ONE WEEK AGO AND RX IN ED WIT ROCEPHIN, DR MCINNIS WILL ADMIT. FAMILY STATES MAY BE SHOWING SIGNS OF DEMENTIA. THE SYNCOPE HAS BEEN SIG. ALTHOUGH NOT REALTED TO CARDAIC EVENT OR RHTHM.    I personally performed the services described in this documentation, which was scribed in my presence. The recorded information has been reviewed and considered. Shelda Jakes, MD   Date: 05/01/2011  Rate: 73  Rhythm: normal sinus rhythm  QRS Axis: left  Intervals: normal  ST/T Wave abnormalities: nonspecific ST/T changes  Conduction Disutrbances:first-degree A-V block   Narrative Interpretation:   Old EKG Reviewed: changes noted NO PACING SPIKES AS COMPARED TO 10/15/10  05/01/2011 IMPRESSION: Diagnoses that have been ruled out:    Diagnoses that are still under consideration:  Final diagnoses:     PLAN: Admit The patient is to return the emergency department if there is any worsening of symptoms. I have reviewed the discharge instructions with the patient/family   CONDITION ON DISCHARGE: STABLE (Stable, Improved, Guarded, Critical)   MEDICATIONS GIVEN IN THE E.D.  Medications  ciprofloxacin (CIPRO) 500 MG tablet (not administered)  0.9 %  sodium chloride infusion (not administered)  cefTRIAXone (ROCEPHIN) 1 g in dextrose 5 % 50 mL IVPB (1 g Intravenous Not Given 05/01/11 1245)  HYDROmorphone (DILAUDID) injection 1 mg (1 mg Intravenous Given 05/01/11 1245)  dextrose 5 % with cefTRIAXone (ROCEPHIN) ADS Med (   Given 05/01/11 1249)     DISCHARGE MEDICATIONS: New Prescriptions   No medications on file         Shelda Jakes, MD 05/01/11 1544  Shelda Jakes, MD 05/01/11 [phone]

## 2011-05-01 NOTE — ED Notes (Signed)
Pt states she was cooking breakfast when she passed out. Pt denies CP. States pain to "tail bone" EMS stated runs of trigeminy and bigeminy enourte. NAD at this time. Pt is alert and oriented. Pt is hypertensive.

## 2011-05-01 NOTE — ED Notes (Signed)
Pt has pacemaker in place left chest.

## 2011-05-01 NOTE — ED Notes (Signed)
Pt to xray

## 2011-05-01 NOTE — ED Notes (Signed)
Pt states that she passed out while she was cooking breakfast and when she woke up she scooted across the floor to the phone. Pt c/o pain in her buttocks but denies chest pain. Pt alert and oriented x 3. Skin warm and dry. Color pink. Breath sounds clear and equal bilaterally. Abdomen soft and non distended. CCM showing SR. No ectopy noted at this time. Pt has sores on arms, legs and buttocks and states that she doesn't know what has caused them. Family at bedside.

## 2011-05-01 NOTE — ED Notes (Signed)
Pt placed on bedpan. Denies pain at this time. CCM showing NSR. Pt awaiting disposition.

## 2011-05-01 NOTE — ED Notes (Signed)
Back from x-ray. CCM showing NSR with no ectopy. Pt alert and oriented. C/o pain in her buttocks and sacral area. Family at bedside.

## 2011-05-01 NOTE — ED Notes (Signed)
Received phone call from [name] at [employer] regarding pacemaker evaluation after evaluation sent. Per [name] the patient's pacemaker is a dual chamber pacemaker and all the thresholds are in range. Battery is good. Pt has had no recorded episodes of high atrial or ventricular activity since May. Faxed report also received. Dr. Deretha Emory notified of results.

## 2011-05-01 NOTE — ED Notes (Signed)
Pt resting with no complaints of pain at this time. CCM showing NSR with rate 62. IV infusing with no edema or redness. Awaiting bed assignment.

## 2011-05-01 NOTE — ED Notes (Signed)
Pt resting with no complaints at this time. CCM showing NSR. Voided in bedpan.

## 2011-05-02 DIAGNOSIS — R55 Syncope and collapse: Principal | ICD-10-CM

## 2011-05-02 LAB — GLUCOSE, CAPILLARY: Glucose-Capillary: 185 mg/dL — ABNORMAL HIGH (ref 70–99)

## 2011-05-02 LAB — CARDIAC PANEL(CRET KIN+CKTOT+MB+TROPI)
CK, MB: 1.8 ng/mL (ref 0.3–4.0)
Relative Index: INVALID (ref 0.0–2.5)
Troponin I: <0.3 ng/mL (ref <0.30)

## 2011-05-02 LAB — URINE CULTURE: Culture  Setup Time: 201207302025

## 2011-05-02 MED ORDER — CLONIDINE HCL 0.1 MG PO TABS
0.1000 mg | ORAL_TABLET | ORAL | Status: DC | PRN
  Administered 2011-05-02 – 2011-05-03 (×2): 0.1 mg via ORAL
  Filled 2011-05-02: qty 1

## 2011-05-02 NOTE — Progress Notes (Signed)
UR Chart Review Completed  

## 2011-05-02 NOTE — Progress Notes (Signed)
[name], [name]               ACCOUNT NO.:  [id number]  MEDICAL RECORD NO.:  [id number]  LOCATION:  A332                          FACILITY:  [facility]  PHYSICIAN:  Gemayel Mascio G. Renard Matter, MD   DATE OF BIRTH:  1926/10/19  DATE OF PROCEDURE: DATE OF DISCHARGE:                                PROGRESS NOTE   This patient was admitted to the hospital following episodes of syncope. She does have urinary tract infection, diabetes type 2, mild dementia, degenerative joint disease with L3 and L4, coronary artery disease with previous myocardial infarction and pacemaker hypertension, dyslipidemia. She continues to have some pain over the lower back.  OBJECTIVE:  VITAL SIGNS:  Blood pressure 125/75, respirations 18, pulse 64, temperature 97.7. LUNGS:  Clear to P and A. HEART:  Regular rhythm.  No murmurs. ABDOMEN:  No palpable organs or masses. BACK:  The patient does have tenderness of paraspinous muscle, low back. NEUROLOGIC:  No focal deficit.  ASSESSMENT:  The patient was admitted following an episode of syncope. She does have urinary tract infection, diabetes mellitus type 2, mild dementia, degenerative joint disease L3 and L4, and coronary artery disease by history with previous myocardial infarction and pacemaker, hypertension, and dyslipidemia.  PLAN:  To continue current regimen.  We will obtain Cardiology consult. We will continue to use Rocephin intravenously.  We will await culture and sensitivity report.     [name] G. Renard Matter, MD     AGM/MEDQ  D:  05/02/2011  T:  05/02/2011  Job:  [id number]

## 2011-05-02 NOTE — Progress Notes (Signed)
Inpatient Diabetes Program Recommendations  AACE/ADA: New Consensus Statement on Inpatient Glycemic Control (2009)  Target Ranges:  Prepandial:   less than 140 mg/dL      Peak postprandial:   less than 180 mg/dL (1-2 hours)      Critically ill patients:  140 - 180 mg/dL   Reason for Visit: Elevated Glucose:  301, 276 mg/dL  Inpatient Diabetes Program Recommendations Correction (SSI): Add Novolog Correction

## 2011-05-02 NOTE — H&P (Signed)
[name], [name]               ACCOUNT NO.:  [id number]  MEDICAL RECORD NO.:  [id number]  LOCATION:  A332                          FACILITY:  [facility]  PHYSICIAN:  Aalina Brege G. Renard Matter, MD   DATE OF BIRTH:  Nov 21, 1926  DATE OF ADMISSION:  05/01/2011 DATE OF DISCHARGE:  LH                             HISTORY & PHYSICAL   HISTORY OF PRESENT ILLNESS:  The patient is an 77 year old white female, who presented to the ED with the history of having had episode of syncope earlier today, also stated she had an episode of syncope on Friday which is ___________3 days prior to this episode.  The patient did fall and complained of pain in gluteal area.  She is unable to get up from _______ awakening from episode due to pain in the gluteal area. She is unsure whether or not she sustained head injury with a fall during the episode.  She called EMS and was brought to the ED where she was evaluated.  The patient was found to have urinary tract infection in the ED and was given IV Rocephin.  X-rays obtained hip and pelvis which showed evidence of diffuse osteopenia, but no fracture, lower lumbar spine showed degenerative changes.  No acute abnormality.  CT of the lumbar spine showed evidence of degenerative changes most notable at L3 and L4.  No fracture noted.  Atherosclerotic changes noted in aorta and vessels.  Chest x-ray, no significant change since previous study, stable appearance, a left mid lung nodule, no evidence of active pulmonary disease noted. The patient did have a UA done which showed numerous wbc's.  Cardiac markers were essentially within normal range.  SOCIAL HISTORY:  The patient does not smoke or drink alcohol or use drugs.  FAMILY HISTORY:  Mother had stomach cancer.  Father had stomach cancer. No heart disease or sudden cardiac death in family members.  ALLERGIES:  No known drug allergies.  PAST MEDICAL HISTORY:  The patient has history of COPD, previous myocardial infarction  in 2011.  She does have pacemaker, diabetes mellitus, osteoporosis, hypertension, and dyslipidemia.  SURGERY:  The patient had appendectomy, cholecystectomy, abdominal hysterectomy, pacemaker placement, left knee joint replacement.  MEDICATION LIST: 1. Aspirin 325 mg daily. 2. Cipro 500 mg twice a day. 3. Insulin Lantus 30 units b.i.d. 4. Meclizine 25 mg t.i.d. 5. Diovan 320 mg daily at bedtime.  REVIEW OF SYSTEMS:  HEENT:  Negative.  CARDIOPULMONARY:  Positive for syncope, no chest pain.  GI:  No bowel irregularity or bleeding.  GU: No dysuria or hematuria.  MUSCULOSKELETAL:  Positive for low back pain.  PHYSICAL EXAMINATION:  GENERAL:  Alert female. VITAL SIGNS:  Blood pressure 177/76, respirations 18, pulse 74, and temperature 97.5. HEENT:  Eyes, PERRLA.  TM negative.  Oropharynx benign. NECK:  Supple.  No JVD or thyroid abnormalities. BREASTS:  Normal masses. HEART:  Regular rhythm, no murmurs. LUNGS:  Clear to P and A. ABDOMEN:  No palpable organs or masses.  No organomegaly. EXTREMITIES:  Free of edema. SKIN:  Multiple small red areas on extremities and abdomen. NEUROLOGICAL:  No focal deficit.  The patient is alert.  Reflexes equal. No motor or sensory deficit.  ASSESSMENT:  The patient was admitted following episode of syncope.  She does have urinary tract infection, diabetes mellitus type 2, mild dementia, degenerative joint disease L3 and L4, coronary artery disease with previous myocardial infarction and pacemaker, hypertension, and dyslipidemia.     Jyren Cerasoli G. Renard Matter, MD     AGM/MEDQ  D:  05/01/2011  T:  05/02/2011  Job:  [id number]

## 2011-05-03 LAB — GLUCOSE, CAPILLARY
Glucose-Capillary: 145 mg/dL — ABNORMAL HIGH (ref 70–99)
Glucose-Capillary: 178 mg/dL — ABNORMAL HIGH (ref 70–99)
Glucose-Capillary: 164 mg/dL — ABNORMAL HIGH (ref 70–99)

## 2011-05-03 MED ORDER — OLMESARTAN MEDOXOMIL 20 MG PO TABS
20.0000 mg | ORAL_TABLET | Freq: Every day | ORAL | Status: DC
  Administered 2011-05-03 – 2011-05-04 (×2): 20 mg via ORAL
  Filled 2011-05-03: qty 1

## 2011-05-03 NOTE — Progress Notes (Signed)
NAMEJANNETTE, [name]               ACCOUNT NO.:  [id number]  MEDICAL RECORD NO.:  [id number]  LOCATION:  A332                          FACILITY:  [facility]  PHYSICIAN:  Lorece Keach G. Renard Matter, MD   DATE OF BIRTH:  1927-05-09  DATE OF PROCEDURE: DATE OF DISCHARGE:                                PROGRESS NOTE   This patient is admitted to hospital following episodes of syncope.  She has had no further episodes of syncope.  She does have urinary tract infection, diabetes mellitus type 2, mild dementia, degenerative joint disease involving L3 and L4 and coronary artery disease with previous myocardial infarction, pacemaker insertion, hypertension, dyslipidemia, continues to have some pain over low back but is sitting up some now in chair.  The patient remains afebrile.  We are awaiting reports on the urine culture.  PHYSICAL EXAMINATION:  LUNGS:  Diminished breath sounds. HEART:  Regular rhythm. ABDOMEN:  No palpable organs or masses.  ASSESSMENT:  The patient admitted following episode of syncope.  She has had no further episode of syncope.  She does have urinary tract infection, diabetes mellitus type 2, mild dementia, degenerative joint disease involving L3 and L4 and coronary artery disease by history with previous myocardial infarction and pacemaker implantation.  She does have hypertension, dyslipidemia.  Plan to continue current regimen. Continues to use IV Rocephin.  Will await culture and sensitivity report.     Amenah Tucci G. Renard Matter, MD     AGM/MEDQ  D:  05/03/2011  T:  05/03/2011  Job:  [id number]

## 2011-05-03 NOTE — Progress Notes (Signed)
Subjective:  This is an 77 year old white female patient who was admitted with recurrent syncope. She is feeling well since she's been in the hospital.  Objective:  Vital Signs in the last 24 hours: Temp:  [97.5 F (36.4 C)-98.2 F (36.8 C)] 98.2 F (36.8 C) (08/01 1042) Pulse Rate:  [60-102] 71  (08/01 1045) Resp:  [18-20] 19  (08/01 1042) BP: (109-178)/(56-112) 162/74 mmHg (08/01 1045) SpO2:  [92 %-98 %] 95 % (08/01 0558)  Intake/Output from previous day: 07/31 0701 - 08/01 0700 In: 1312 [P.O.:840; I.V.:472] Out: 350 [Urine:350] Intake/Output from this shift: I/O this shift: In: 600 [P.O.:600] Out: -   Physical Exam: NECK: Without JVD, HJR, or bruit LUNGS: Clear anterior, posterior, lateral HEART: Regular rate and rhythm, 2/6 systolic murmur at the left sternal border, no gallop, rub, bruit, thrill, or heave EXTREMITIES: Without cyanosis, clubbing, or edema   Lab Results:  [facility] 05/01/11 1107  WBC 9.4  HGB 15.6*  PLT 222    Basename 05/01/11 1107  NA 135  K 4.2  CL 99  CO2 29  GLUCOSE 264*  BUN 14  CREATININE 0.74    Basename 05/02/11 0306 05/01/11 1910  TROPONINI <0.30 <0.30   Hepatic Function Panel No results found for this basename: PROT,ALBUMIN,AST,ALT,ALKPHOS,BILITOT,BILIDIR,IBILI in the last 72 hours No results found for this basename: CHOL in the last 72 hours No results found for this basename: PROTIME in the last 72 hours  Imaging:   Cardiac Studies: ECHO 10/31/2009 Study Conclusions  - Left ventricle: The estimated ejection fraction was 65%. Images were inadequate for LV wall motion assessment. - Aortic valve: Sclerosis without stenosis. Valve area: 2.38cm^2(VTI). Valve area: 1.87cm^2 (Vmax). Impressions:  - The study is technically limited. However, there is very good LV function. Transthoracic echocardiography. M-mode, complete 2D, spectral Doppler, and color Doppler. Height: Height: 160cm. Height: 63in. Weight: Weight: 92kg.  Weight: 202.4lb. Body mass index: BMI: 35.9kg/m^2. Body surface area: BSA: 2.81m^2. Blood pressure: 174/67. Patient status: Inpatient. Location: ICU/CCU    Assessment/Plan:  Syncope:Patient has had 3 episodes of syncope since April. This one was in the setting of a UTI. She does not recall how long she was out. She states she had trouble moving her legs to stand after this episode. She'll need follow up with Dr. Johney Frame where her pacemaker can be interrogated. PTVDP: Medtronic pacemaker placed on 11/02/2009 for syncope secondary to Mobitz 2 followed by Dr. Johney Frame.Will have pacer checked in the hospital. Hypertension:Patient is not orthostatic and her blood pressure is actually on the high side. She was on Diovan 320 mg daily back in January.Will resume Diovan at 160 mg today Hyperlipidemia:   LOS: 2 days    Jacolyn Reedy 05/03/2011, 12:33 PM  Cardiology Attending-Everest Brod  Feels better.  No dizziness or falls since admit. No orthostatic change in BP. Lab negative except for increase blood glucose. EKG-NSR, first degree AVB, RBBB, LAFB vs prior IMI, cannot R/O prior septal MI. Improved with Rx of UTI.  No further cardiac testing or treatment anticipated.  We will arrange a return visit with our EP Service after hospital discharge.  Woodbury Bing, MD

## 2011-05-04 LAB — GLUCOSE, CAPILLARY: Glucose-Capillary: 170 mg/dL — ABNORMAL HIGH (ref 70–99)

## 2011-05-04 MED ORDER — NITROFURANTOIN MONOHYD MACRO 100 MG PO CAPS: 100.0000 mg | ORAL_CAPSULE | Freq: Two times a day (BID) | ORAL | Status: AC

## 2011-05-04 NOTE — Consult Note (Signed)
CSW received referral for transportation.  Pt reports she has money at home and will call [employer] when ready to be picked up.  CSW and patient advocate provided clothes for pt to wear home.  CSW to sign off.  Karn Cassis

## 2011-05-04 NOTE — Discharge Summary (Signed)
[name], [name]               ACCOUNT NO.:  [id number]  MEDICAL RECORD NO.:  [id number]  LOCATION:  A332                          FACILITY:  [facility]  PHYSICIAN:  Pistol Kessenich G. Renard Matter, MD   DATE OF BIRTH:  1927-01-14  DATE OF ADMISSION:  05/01/2011 DATE OF DISCHARGE:  08/02/2012LH                              DISCHARGE SUMMARY   DIAGNOSES: 1. Syncopal episodes, probable vasovagal. 2. Urinary tract infection secondary to Escherichia coli. 3. Diabetes mellitus type 2. 4. Mild dementia. 5. Degenerative joint disease involving L3-L4 coronary artery disease     with previous myocardial infarction and pacemaker. 6. Hypertension. 7. Dyslipidemia. 8. Fall with contusion in low back.  CONDITION:  Stable and improved at the time of her discharge.  HISTORY:  This 77 year old white female presented to the ED with a history of having had episodes of syncope earlier in the day.  She also had an episode of syncope 3 days prior to this episode.  The patient did fall and complained of pain in gluteal area.  She was unable to get up from down position, developed pain in gluteal area.  She was not sure whether or not she sustained head injury with a fall during episode. She called EMS and was brought to the ED where she was evaluated.  The patient was found to have urinary tract infection in the ED, was given IV Rocephin.  X-rays obtained of hip and pelvis which showed evidence of diffuse osteopenia, but no fracture.  Lower lumbar spine showed evidence of degenerative changes, most notable at L3 and L4, no fracture noted. Atherosclerotic changes noted in aorta and vessels.  Chest x-ray, no significant change since previous study.  Stable appearance of left mid lung nodule.  No evidence of active pulmonary disease noted.  The patient did have a UA done, which showed numerous WBCs.  Cardiac markers were essentially within normal range.  PHYSICAL EXAMINATION:  VITAL SIGNS:  Alert female with blood  pressure 177/76, respiration 18, pulse 84, temp 97.5. HEENT:  Eyes:  PERRLA.  TMs negative.  Oropharynx benign. NECK:  Supple.  No JVD or thyroid abnormalities. BREASTS:  Normal.  No masses. HEART:  Regular rhythm.  No murmurs. LUNGS:  Clear to P and A. ABDOMEN:  No palpable organs or masses.  No organomegaly. EXTREMITIES:  Free of edema. SKIN:  Multiple red areas on extremities and abdomen. NEUROLOGICAL:  No focal deficit.  The patient is alert.  Reflexes equal. No motor or sensory deficit.  LABORATORY DATA:  Admission CBC:  WBC 9400 with hemoglobin 15.6, hematocrit 47.8.  Urinalysis on admission showed a numerous leukocytes in ED.  Chemistries on admission:  Sodium 135, potassium 4.2, chloride 99, CO2 29, glucose 264, BUN 14, creatinine 0.74, calcium 10.1. Troponin less than 0.30, CPK-MB 82, relative index 3.  Subsequent cardiac markers were normal.  Glucoses ranged from 177 to 265.  CT of lumbar spine and thoracic spine, degenerative changes, most notable at L3 and L4.  No discrete fracture noted.  Atherosclerotic changes in aorta and branch vessels.  CT hip bilateral with pelvis, no acute abnormality.  Portable chest x-ray, no evidence of active pulmonary disease, stable appearance of  the chest since previous study.  HOSPITAL COURSE:  The patient was placed at bedrest on admission.  She was started on intravenous fluids.  IV Rocephin 1 g was started every 24 hours.  She was placed on DVT prophylaxis, Lovenox 40 mg daily.  She was continued on Lantus insulin 30 units b.i.d.  Her blood sugars were monitored.  She was continued on olmesartan 20 mg daily and half normal saline as continuous IV, was given p.r.n. medications as needed for nausea and blood pressure elevation.  The patient did not have any further episodes of syncope during the hospital stay.  She did have considerable amount of discomfort in the low back, although she had not had any fractures.  She did have urinary  tract infection secondary to E. coli and remained on IV Rocephin, was seen in consultation by Cardiology.  We did not recommend further intervention at present time. Would recommend she would need followup with Dr. Johney Frame with reference to her pacemaker.  She was not orthostatic.  Blood pressure is actually on the high side somewhat.  It is felt she can be discharged. Discharged on Lantus insulin 30 units b.i.d., olmesartan 20 mg daily and prescription will be written for antibiotic.     Avriel Kandel G. Renard Matter, MD     AGM/MEDQ  D:  05/04/2011  T:  05/04/2011  Job:  [id number]

## 2011-05-08 NOTE — Consult Note (Signed)
NAMECHERIDAN, KIBLER               ACCOUNT NO.:  [id number]  MEDICAL RECORD NO.:  [id number]  LOCATION:  A332                          FACILITY:  [facility]  PHYSICIAN:  Gerrit Friends. Dietrich Pates, MD, FACCDATE OF BIRTH:  [birth year]  DATE OF CONSULTATION:  05/02/2011 DATE OF DISCHARGE:  05/04/2011                                CONSULTATION   REFERRING PHYSICIAN:  Angus G. McInnis, MD  HISTORY OF PRESENT ILLNESS:  A 77 year old woman with multiple prior episodes of syncope now admitted after loss of consciousness with soft tissue injury.  Ms. Dombeck is followed by Dr. Myrtis Ser in our [facility] office and by Dr. Johney Frame for electrophysiology care.  This nice woman first presented in December 2010 with loss of consciousness and was admitted to [facility]  Basic evaluation was negative.  She subsequently was found to have conduction system disease for which a pacemaker was implanted in February of 2012.  Unfortunately, syncope has continued. The current episode occurred while she was moving a batter at home.  She had a similar spell 3 days earlier.  There was no warning, she fell to the floor and awakened with pain in her sacral region.  EMS was summoned and transported the patient to the emergency department where initial evaluation was negative.  X-rays revealed osteopenia and DJD of the lumbosacral spine without apparent acute injury.  She has felt fine since hospital admission.  Testing approximately 7 months ago showed a normal echocardiogram and normal stress nuclear study.  Pacemaker function was recently verified and was found to be normal.  She has never had warning of impending loss of consciousness but does experience mild dizziness at other times. Symptoms are not clearly orthostatic in their recurrence, and orthostatic hypotension has not been documented in the past.  PAST MEDICAL HISTORY:  Notable for hypertension and hyperlipidemia that have been well-controlled.  She has diabetes  that has not been particularly brittle or difficult to manage.  She has osteoporosis, history of vertigo, and a history of herpes zoster.  PRIOR SURGERIES:  Appendectomy, cholecystectomy, abdominal hysterectomy, and left TKA, left knee.  CURRENT MEDICATIONS: 1. Aspirin 325 mg daily. 2. Lantus insulin 30 units b.i.d. 3. Meclizine 25 mg t.i.d. 4. Diovan 320 mg q.p.m.  SOCIAL HISTORY:  The patient is a former smoker with a total consumption of 60 pack years that was discontinued nearly 3 decades ago.  Denies excessive use of alcohol.  ALLERGIES:  No known drug allergies.  FAMILY HISTORY:  Neoplastic disease with gastric carcinoma in both her mother and father.  There is positive family history for diabetes but no prominent history of vascular disease.  REVIEW OF SYSTEMS:  Stable weight and appetite; no dysuria, hematuria, fever or chills.  All other systems reviewed and are negative.  PHYSICAL EXAMINATION:  GENERAL:  Pleasant older woman in no acute distress. VITAL SIGNS:  The blood pressure is 170/75, respirations 18 and unlabored, heart rate 75 and regular, temperature 97.5. HEENT:  EOMs full; normal lids and conjunctivae; normal oral mucosa. NECK:  No jugular venous distention; normal carotid upstrokes without bruits. ENDOCRINE:  No thyromegaly. HEMATOPOIETIC:  No adenopathy. SKIN:  No significant lesions. PSYCHIATRIC:  Alert and  oriented; normal affect. LUNGS:  Clear. CARDIAC:  Normal first and second heart sounds; modest systolic ejection murmur. ABDOMEN:  Soft and nontender; no masses; no organomegaly. EXTREMITIES:  Trace edema; distal pulses intact. NEUROLOGIC:  Symmetric strength and tone; normal cranial nerves.  EKG:  Normal sinus rhythm; borderline first-degree AV block; left axis deviation:  Left anterior fascicular block versus prior myocardial infarction; incomplete right bundle branch block.  CHEST X-RAY:  Normal heart size; probable emphysematous changes;  dual- chamber pacemaker in place; calcification of the thoracic aorta; no change from previous study - vague left midlung pulmonary nodule cannot be excluded, but is unchanged.  IMPRESSION:  Ms. Gonzalo presents with recurrent syncope, at least third occurrence since pacing was initiated.  She will require interrogation of her device and reassessment by our Electrophysiology Service.  This need not be performed in hospital, and an outpatient visit will be arranged.  With myocardial infarction excluded and no arrhythmias identified on at least 24 hours of monitoring, no further testing in hospital is required.  Control of hypertension appears to be inadequate.  If additional determinations blood pressure elevated, a diuretic could be added to her regime.  Hyperlipidemia will be assessed with a lipid profile.  There is no documentation of cardiovascular or other significant vascular disease except for the identification of calcium in the wall of her thoracic aorta.  Treatment will be initiated for hyperlipidemia if appropriate. [facility] appreciates the request for consultation and the opportunity to continue to follow this nice woman.     Gerrit Friends. Dietrich Pates, MD, [facility]    RMR/MEDQ  D:  05/08/2011  T:  05/08/2011  Job:  [id number]

## 2011-05-15 ENCOUNTER — Ambulatory Visit (INDEPENDENT_AMBULATORY_CARE_PROVIDER_SITE_OTHER): Payer: Medicare Other | Admitting: Internal Medicine

## 2011-05-15 ENCOUNTER — Encounter: Payer: Self-pay | Admitting: Internal Medicine

## 2011-05-15 VITALS — BP 170/85 | HR 71 | Resp 16 | Ht 64.0 in | Wt 187.0 lb

## 2011-05-15 DIAGNOSIS — R55 Syncope and collapse: Secondary | ICD-10-CM

## 2011-05-15 DIAGNOSIS — I441 Atrioventricular block, second degree: Secondary | ICD-10-CM | POA: Insufficient documentation

## 2011-05-15 DIAGNOSIS — I442 Atrioventricular block, complete: Secondary | ICD-10-CM

## 2011-05-15 DIAGNOSIS — I10 Essential (primary) hypertension: Secondary | ICD-10-CM

## 2011-05-15 DIAGNOSIS — E119 Type 2 diabetes mellitus without complications: Secondary | ICD-10-CM

## 2011-05-15 LAB — PACEMAKER DEVICE OBSERVATION
AL AMPLITUDE: 5.6 mv
AL IMPEDENCE PM: 510 Ohm
RV LEAD IMPEDENCE PM: 709 Ohm
RV LEAD THRESHOLD: 1 V
VENTRICULAR PACING PM: 16

## 2011-05-15 NOTE — Assessment & Plan Note (Signed)
Normal pacemaker function See Pace Art report No changes today  

## 2011-05-15 NOTE — Assessment & Plan Note (Signed)
Recent syncope is of unclear etiology, but is likely due to dehydration and UTI.  Her chronically elevated blood sugars increase her risks for dehydration due to osmotic diuresis. The importance of adequate hydration and glucose control were stressed with the patient today.  Her pacemaker is presently functioning normally. No changes today.

## 2011-05-15 NOTE — Progress Notes (Signed)
The patient presents today for routine electrophysiology followup.  She was recently hospitalized at [facility] for syncope.  Since discharge, the patient reports doing very well.  She reports occasional postural dizziness but denies any further syncope.  Her blood sugars have been elevated.  Today, she denies symptoms of palpitations, chest pain, shortness of breath, orthopnea, PND, lower extremity edema, dizziness, or neurologic sequela.  The patient feels that she is tolerating medications without difficulties and is otherwise without complaint today.   Past Medical History  Diagnosis Date  . COPD (chronic obstructive pulmonary disease)   . Syncope and collapse   . Hypertension   . Hyperlipidemia   . Benign positional vertigo   . History of shingles   . Diabetes mellitus   . Osteoporosis   . Second degree Mobitz II AV block     s/p PPM 11/02/09  . Blood transfusion   . Myocardial infarction    Past Surgical History  Procedure Date  . Appendectomy   . Insert / replace / remove pacemaker 11/02/09    S/p Medtronic PPM by JA  . Cholecystectomy   . Abdominal hysterectomy   . Eye surgery   . Total knee arthroplasty 1980's    Lt    Current Outpatient Prescriptions  Medication Sig Dispense Refill  . aspirin EC 325 MG tablet Take 325 mg by mouth as needed. For pain      . insulin glargine (LANTUS) 100 UNIT/ML injection Inject 30 Units into the skin 2 (two) times daily.        . valsartan (DIOVAN) 320 MG tablet Take 320 mg by mouth daily.          No Known Allergies  History   Social History  . Marital Status: Single    Spouse Name: N/A    Number of Children: N/A  . Years of Education: N/A   Occupational History  . Retired    Social History Main Topics  . Smoking status: Former Smoker -- 30 years  . Smokeless tobacco: Not on file  . Alcohol Use: No  . Drug Use: No  . Sexually Active: No   Other Topics Concern  . Not on file   Social History Narrative  . No narrative  on file    Family History  Problem Relation Age of Onset  . Cancer Mother     Stomach  . Cancer Father     Stomach  . Heart disease Neg Hx     Nor sudden cardiac death  . Diabetes Other     Several family members    ROS-  All systems are reviewed and are negative except as outlined in the HPI above    Physical Exam: Filed Vitals:   05/15/11 1615  BP: 170/85  Pulse: 71  Resp: 16  Height: 5\' 4"  (1.626 m)  Weight: 187 lb (84.823 kg)    GEN- The patient is well appearing, alert and oriented x 3 today.   Head- normocephalic, atraumatic Eyes-  Sclera clear, conjunctiva pink Ears- hearing intact Oropharynx- clear Neck- supple, no JVP Lungs- Clear to ausculation bilaterally, normal work of breathing Chest- pacemaker pocket is well healed Heart- Regular rate and rhythm, no murmurs, rubs or gallops, PMI not laterally displaced GI- soft, NT, ND, + BS Extremities- no clubbing, cyanosis, or edema MS- atrophy of R leg from prior polio Neuro- strength and sensation are intact  Pacemaker interrogation- reviewed in detail today,  See PACEART report  Assessment and Plan:

## 2011-05-15 NOTE — Assessment & Plan Note (Signed)
Above goal, however given recent syncope, I am reluctant to control BP too closely at this time No changes today

## 2011-05-15 NOTE — Assessment & Plan Note (Signed)
As above.

## 2011-08-07 ENCOUNTER — Encounter (HOSPITAL_COMMUNITY): Payer: Self-pay | Admitting: *Deleted

## 2011-08-07 ENCOUNTER — Inpatient Hospital Stay (HOSPITAL_COMMUNITY)
Admission: EM | Admit: 2011-08-07 | Discharge: 2011-08-10 | DRG: 312 | Disposition: A | Discharge status: Home / Self Care | Payer: Medicare Other | Attending: Family Medicine | Admitting: Family Medicine

## 2011-08-07 ENCOUNTER — Other Ambulatory Visit: Payer: Self-pay

## 2011-08-07 ENCOUNTER — Emergency Department (HOSPITAL_COMMUNITY): Payer: Medicare Other

## 2011-08-07 DIAGNOSIS — F411 Generalized anxiety disorder: Secondary | ICD-10-CM | POA: Diagnosis present

## 2011-08-07 DIAGNOSIS — J4489 Other specified chronic obstructive pulmonary disease: Secondary | ICD-10-CM | POA: Diagnosis present

## 2011-08-07 DIAGNOSIS — IMO0001 Reserved for inherently not codable concepts without codable children: Secondary | ICD-10-CM | POA: Diagnosis present

## 2011-08-07 DIAGNOSIS — I2510 Atherosclerotic heart disease of native coronary artery without angina pectoris: Secondary | ICD-10-CM | POA: Diagnosis present

## 2011-08-07 DIAGNOSIS — R739 Hyperglycemia, unspecified: Secondary | ICD-10-CM

## 2011-08-07 DIAGNOSIS — I10 Essential (primary) hypertension: Secondary | ICD-10-CM | POA: Insufficient documentation

## 2011-08-07 DIAGNOSIS — R55 Syncope and collapse: Principal | ICD-10-CM | POA: Diagnosis present

## 2011-08-07 DIAGNOSIS — I252 Old myocardial infarction: Secondary | ICD-10-CM

## 2011-08-07 DIAGNOSIS — E785 Hyperlipidemia, unspecified: Secondary | ICD-10-CM | POA: Diagnosis present

## 2011-08-07 DIAGNOSIS — E119 Type 2 diabetes mellitus without complications: Secondary | ICD-10-CM

## 2011-08-07 DIAGNOSIS — F0390 Unspecified dementia without behavioral disturbance: Secondary | ICD-10-CM | POA: Diagnosis present

## 2011-08-07 DIAGNOSIS — I441 Atrioventricular block, second degree: Secondary | ICD-10-CM | POA: Insufficient documentation

## 2011-08-07 DIAGNOSIS — J449 Chronic obstructive pulmonary disease, unspecified: Secondary | ICD-10-CM | POA: Diagnosis present

## 2011-08-07 DIAGNOSIS — Z950 Presence of cardiac pacemaker: Secondary | ICD-10-CM

## 2011-08-07 HISTORY — DX: Type 2 diabetes mellitus without complications: E11.9

## 2011-08-07 HISTORY — DX: Essential (primary) hypertension: I10

## 2011-08-07 HISTORY — DX: Syncope and collapse: R55

## 2011-08-07 LAB — BASIC METABOLIC PANEL
BUN: 14 mg/dL (ref 6–23)
CO2: 30 mEq/L (ref 19–32)
Calcium: 9.7 mg/dL (ref 8.4–10.5)
Creatinine, Ser: 0.86 mg/dL (ref 0.50–1.10)
GFR calc non Af Amer: 61 mL/min — ABNORMAL LOW (ref >90)
Glucose, Bld: 294 mg/dL — ABNORMAL HIGH (ref 70–99)
Sodium: 137 mEq/L (ref 135–145)

## 2011-08-07 LAB — URINALYSIS, ROUTINE W REFLEX MICROSCOPIC
Bilirubin Urine: NEGATIVE
Glucose, UA: >1000 mg/dL — AB
Ketones, ur: NEGATIVE mg/dL
Protein, ur: NEGATIVE mg/dL
Urobilinogen, UA: 0.2 mg/dL (ref 0.0–1.0)

## 2011-08-07 LAB — URINE MICROSCOPIC-ADD ON

## 2011-08-07 LAB — DIFFERENTIAL
Eosinophils Absolute: 0.3 10*3/uL (ref 0.0–0.7)
Eosinophils Relative: 4 % (ref 0–5)
Lymphs Abs: 1.7 10*3/uL (ref 0.7–4.0)
Monocytes Absolute: 0.8 10*3/uL (ref 0.1–1.0)
Monocytes Relative: 10 % (ref 3–12)

## 2011-08-07 LAB — CBC
HCT: 43.6 % (ref 36.0–46.0)
MCH: 28.1 pg (ref 26.0–34.0)
MCV: 87.4 fL (ref 78.0–100.0)
Platelets: 223 10*3/uL (ref 150–400)
RBC: 4.99 MIL/uL (ref 3.87–5.11)

## 2011-08-07 MED ORDER — INSULIN ASPART 100 UNIT/ML ~~LOC~~ SOLN: 0.0000 [IU] | SUBCUTANEOUS | Status: DC

## 2011-08-07 MED ORDER — SODIUM CHLORIDE 0.9 % IV SOLN
Freq: Once | INTRAVENOUS | Status: AC
  Administered 2011-08-08: 01:00:00 via INTRAVENOUS

## 2011-08-07 NOTE — ED Notes (Signed)
Pt ambulated with assistance, without problem

## 2011-08-07 NOTE — ED Notes (Signed)
Alert, talking, no distress.

## 2011-08-07 NOTE — ED Provider Notes (Signed)
History  Scribed for Joya Gaskins, MD, the patient was seen in room [facility]04. This chart was scribed by Hillery Hunter.   CSN: [id number] Arrival date & time: 08/07/2011  8:05 PM   First MD Initiated Contact with Patient 08/07/11 2008      Chief Complaint  Patient presents with  . Headache   Patient is a 77 y.o. female presenting with weakness. The history is provided by the patient.  Weakness The primary symptoms include headaches and dizziness. Primary symptoms do not include syncope, loss of consciousness, altered mental status, visual change, focal weakness, loss of sensation or speech change. The symptoms are unchanged. The neurological symptoms are diffuse.  The headache is associated with weakness (generalized). The headache is not associated with visual change.  Dizziness also occurs with weakness (generalized).  Additional symptoms include weakness (generalized).    [name] is a 77 y.o. female who presents to the Emergency Department complaining of gradual onset generalized weakness. She states that she was at home today with a "swimming" headache that was worse with standing and that she became dizzy whenever standing as well. She described this dizziness as "room spinning" and she denies any recent falls or trauma. She denies associated chest pain. Her PCP is Dr. Renard Matter Pt does report some SOB Time seen: 20:32  Past Medical History  Diagnosis Date  . COPD (chronic obstructive pulmonary disease)   . Syncope and collapse   . Hypertension   . Hyperlipidemia   . Benign positional vertigo   . History of shingles   . Diabetes mellitus   . Osteoporosis   . Second degree Mobitz II AV block     s/p PPM 11/02/09  . Blood transfusion   . Myocardial infarction     Past Surgical History  Procedure Date  . Appendectomy   . Insert / replace / remove pacemaker 11/02/09    S/p Medtronic PPM by JA  . Cholecystectomy   . Abdominal hysterectomy   . Eye surgery    . Total knee arthroplasty 1980's    Lt    Family History  Problem Relation Age of Onset  . Cancer Mother     Stomach  . Cancer Father     Stomach  . Heart disease Neg Hx     Nor sudden cardiac death  . Diabetes Other     Several family members    History  Substance Use Topics  . Smoking status: Former Smoker -- 30 years  . Smokeless tobacco: Not on file  . Alcohol Use: No   Review of Systems  Constitutional: Negative for unexpected weight change.  Respiratory: Positive for shortness of breath (occasionally, unchanged).   Cardiovascular: Negative for chest pain and syncope.  Gastrointestinal: Positive for diarrhea (a few days ago, mild). Negative for abdominal pain.  Genitourinary: Positive for hematuria.  Musculoskeletal: Negative for back pain.  Skin: Negative for wound.  Neurological: Positive for dizziness, weakness (generalized) and headaches. Negative for speech change, focal weakness, loss of consciousness, syncope and speech difficulty.  Psychiatric/Behavioral: Negative for confusion and altered mental status.  All other systems reviewed and are negative.    Allergies  Review of patient's allergies indicates no known allergies.  Home Medications   Current Outpatient Rx  Name Route Sig Dispense Refill  . ASPIRIN EC 325 MG PO TBEC Oral Take 325 mg by mouth as needed. For pain    . METFORMIN HCL 500 MG PO TABS Oral Take 500 mg by mouth  2 (two) times daily.      Marland Kitchen PANTOPRAZOLE SODIUM 40 MG PO TBEC Oral Take 40 mg by mouth daily.      . INSULIN GLARGINE 100 UNIT/ML [place] SOLN Subcutaneous Inject 30 Units into the skin 2 (two) times daily.      Marland Kitchen VALSARTAN 320 MG PO TABS Oral Take 320 mg by mouth daily.        Triage Vitals: BP 191/71  Pulse 67  Wt 177 lb (80.287 kg)  SpO2 95% BP 166/61  Pulse 62  Temp(Src) 98.2 F (36.8 C) (Oral)  Resp 19  Wt 177 lb (80.287 kg)  SpO2 96%   Physical Exam  CONSTITUTIONAL: Well developed/well nourished HEAD AND FACE:  Normocephalic/atraumatic EYES: EOMI/PERRL ENMT: Mucous membranes moist NECK: supple no meningeal signs SPINE:entire spine nontender CV: S1/S2 noted, no murmurs/rubs/gallops noted LUNGS: Lungs are clear to auscultation bilaterally, no apparent distress ABDOMEN: soft, nontender, no rebound or guarding GU:no cva tenderness NEURO: Pt is awake/alert, moves all extremities x4, Awake/alert, facies symmetric, no arm or leg drift is noted, Cranial nerves 3/4/5/6/04/09/09/11/12 tested and intact EXTREMITIES: pulses normal, full ROM SKIN: warm, color normal PSYCH: no abnormalities of mood noted   ED Course  Procedures    Labs Reviewed  CBC  DIFFERENTIAL  BASIC METABOLIC PANEL  URINALYSIS, ROUTINE W REFLEX MICROSCOPIC  TROPONIN I      OTHER DATA REVIEWED: Nursing notes, vital signs, and past medical records reviewed. All labs/vitals reviewed and considered xrays reviewed and considered    DIAGNOSTIC STUDIES: Oxygen Saturation is 95% on room air, normal by my interpretation.     ED COURSE / COORDINATION OF CARE: 20:39. Ordered CXR, labs pending 22:40. Discussed test findings and treatment plan with patient at bedside. Discussed recent dizziness and plan of care options. Patient prefers admission over going home due to her persistent dizziness, shortness of breath and generalized weakness that makes it difficult for her to care for herself alone at home.  22:47. Discussed with Dr. Ouida Sills who agrees to admit patient.   MDM      Date: 08/07/2011  Rate: 64  Rhythm: normal sinus rhythm  QRS Axis: left  Intervals: normal  ST/T Wave abnormalities: nonspecific ST changes  Conduction Disutrbances:none  Narrative Interpretation:   Old EKG Reviewed: unchanged    I personally performed the services described in this documentation, which was scribed in my presence. The recorded information has been reviewed and considered.       Joya Gaskins, MD 08/07/11 [phone]

## 2011-08-07 NOTE — ED Notes (Addendum)
Alert, skin warm and dry, mult c/o that she says have been going on  For 2-3 mos.  Pt has scattered itching rash present for several mos.  Look like insect bites.  Says she has had hematuria forseveral days   And has finished her antibiotic for this.  Has hx of htn , but says she is not taking any med for for this.  Pt lives alone and says she has called her son to let him know about going to ER.  Pt says she is alone a lot and no one comes to see her.  Alert, oriented.

## 2011-08-07 NOTE — ED Notes (Signed)
Headache, hematuria, weakness.  htn  Nausea,

## 2011-08-08 ENCOUNTER — Encounter (HOSPITAL_COMMUNITY): Payer: Self-pay | Admitting: *Deleted

## 2011-08-08 DIAGNOSIS — R55 Syncope and collapse: Secondary | ICD-10-CM

## 2011-08-08 LAB — CARDIAC PANEL(CRET KIN+CKTOT+MB+TROPI)
CK, MB: 2.7 ng/mL (ref 0.3–4.0)
Total CK: 36 U/L (ref 7–177)

## 2011-08-08 LAB — GLUCOSE, CAPILLARY: Glucose-Capillary: 256 mg/dL — ABNORMAL HIGH (ref 70–99)

## 2011-08-08 MED ORDER — SODIUM CHLORIDE 0.9 % IJ SOLN
INTRAMUSCULAR | Status: AC
  Filled 2011-08-08: qty 3

## 2011-08-08 MED ORDER — OLMESARTAN MEDOXOMIL 20 MG PO TABS
40.0000 mg | ORAL_TABLET | Freq: Every day | ORAL | Status: DC
  Administered 2011-08-08 – 2011-08-10 (×3): 40 mg via ORAL
  Filled 2011-08-08 (×3): qty 2

## 2011-08-08 MED ORDER — INSULIN ASPART 100 UNIT/ML ~~LOC~~ SOLN
0.0000 [IU] | Freq: Three times a day (TID) | SUBCUTANEOUS | Status: DC
  Administered 2011-08-08: 3 [IU] via SUBCUTANEOUS
  Administered 2011-08-08 (×2): 5 [IU] via SUBCUTANEOUS
  Administered 2011-08-09 (×2): 3 [IU] via SUBCUTANEOUS
  Administered 2011-08-09 – 2011-08-10 (×3): 5 [IU] via SUBCUTANEOUS

## 2011-08-08 NOTE — Consult Note (Signed)
Patient seen and examined. Reviewed [name]'s findings and documentation. [name] presents with somewhat vague symptoms  - describing anxiety, weakness, lightheadedness. No chest pain or frank syncope however.  She is noted to have uncontrolled diabetes with hyperglycemia, also hypertensive. Compliance with medications is difficult to assess, although patient states that she takes her medications.   Heart rhythm is stable with appropriate PPM function by interrogation with Dr. Johney Frame in August. ECG now shows NSR. Cardiac markers are normal.  There is mention of CAD and MI in history, although this is not specifically found on review of old records. She did have a low risk Myoview on previous evaluation. Her outpatient followup is with Dr. Myrtis Ser and Dr. Johney Frame.  Patient states that she lives in a house in [place] by herself, pays rent to her son. Appears somewhat disheveled on exam, lungs clear with decreased breath sounds, heart with RRR, PPM pocket stable, no pitting edema, skin with some excoriations and possible insect bites.  Labwork reviewed as well as CXR report.  1. Doubt that current presentation is specifically cardiac in etiology. PPM function has been normal on outpatient followup, and in NSR by ECG, no pauses or arrhythmias. Cardiac markers are normal, and the reported history of CAD and MI is unclear, not documented by old record review.  2. Would focus on stabilization of diabetes control and blood pressure management.  3. Consider Case manager consultation to look into living situation and family support.

## 2011-08-08 NOTE — Progress Notes (Signed)
UR Chart Review Completed  

## 2011-08-08 NOTE — Consult Note (Signed)
CARDIOLOGY CONSULT NOTE  Patient ID: [name] MRN: [id number] DOB/AGE: [birth year] 77 y.o.  Admit date: 08/07/2011 Referring Physician: Renard Greene Primary Physician: [name] :Primary Cardiologist: [name] [facility]) Reason for Consultation: Near Syncope Active Problems:  Near syncope  Second degree Mobitz II AV block  HYPERTENSION  HPI: [name] is an 77 y/o patient of [name] in the [name] office and [name] that is  followed for ongoing assessment and treatment of hypertension, Second Degree Mobitiz II AV block: s/p Medtronic  pacemaker placement in February 2011. She also has a history of difficult to control diabetes, COPD and mild dementia.  She presented to ER with complaints of headache, dizziness and "swimming head".  Found to be hypertensive with BP of 191/71, HR of 95 bpm.  She was hyperglycemic with glucose of 294. Urinalysis also showed elevated glucose >1000 but negative for ketones or protein. Dr. Renard Greene asked for cardiac recommendations concerning her symptoms as he states she had a history of CAD and previous MI although previous  records do not correlate this.  She denies medical or dietary noncompliance.  Depends on children to help her with groceries and getting Rx filled. She was last seen by [name] in August of 2012 for pacemaker check. She does not remember the last time she saw [name]. She has since relocated to [name].  She denies chest pain, or shortness of breath, edema. She has been admitted in the past with syncope, thought to be related to vagal etiology.   Review of systems complete and found to be negative unless listed above   Past Medical History  Diagnosis Date  . COPD (chronic obstructive pulmonary disease)   . Syncope   . Essential hypertension, benign   . Hyperlipidemia   . Benign positional vertigo   . History of shingles   . Type 2 diabetes mellitus   . Osteoporosis   . Second degree Mobitz II AV block     s/p PPM 11/02/09  .  Myocardial infarction   . Near syncope 09/09/2009    Family History  Problem Relation Age of Onset  . Cancer Mother     Stomach  . Cancer Father     Stomach  . Heart disease Neg Hx     Nor sudden cardiac death  . Diabetes Other     Several family members  . Diabetes Sister   . Diabetes Brother   . Diabetes Sister     History   Social History  . Marital Status: Single    Spouse Name: N/A    Number of Children: N/A  . Years of Education: N/A   Occupational History  . Retired    Social History Main Topics  . Smoking status: Former Smoker -- 30 years  . Smokeless tobacco: Not on file  . Alcohol Use: No  . Drug Use: No  . Sexually Active: No   Other Topics Concern  . Not on file   Social History Narrative  . No narrative on file    Past Surgical History  Procedure Date  . Appendectomy   . Insert / replace / remove pacemaker 11/02/09    S/p Medtronic PPM - [name]  . Cholecystectomy   . Abdominal hysterectomy   . Eye surgery   . Total knee arthroplasty 1980's    Left     Prescriptions prior to admission  Medication Sig Dispense Refill  . aspirin EC 325 MG tablet Take 325 mg by mouth as  needed. For pain      . metFORMIN (GLUCOPHAGE) 500 MG tablet Take 500 mg by mouth 2 (two) times daily.        . pantoprazole (PROTONIX) 40 MG tablet Take 40 mg by mouth daily.        . insulin glargine (LANTUS) 100 UNIT/ML injection Inject 30 Units into the skin 2 (two) times daily.        . valsartan (DIOVAN) 320 MG tablet Take 320 mg by mouth daily.          Physical Exam: Blood pressure 165/79, pulse 70, temperature 97.8 F (36.6 C), temperature source Oral, resp. rate 18, height 5\' 4"  (1.626 m), weight 177 lb (80.287 kg), SpO2 98.00%.   General: Well developed, well nourished, in no acute distress. Edentulous  Head: Eyes PERRLA, No xanthomas.   Normal cephalic and atramatic  Lungs: Clear bilaterally to auscultation and percussion. Heart: HRRR S1 S2, without MRG.   Pulses are 2+ & equal.            No carotid bruit. No JVD.  No abdominal bruits. No femoral bruits. Abdomen: Bowel sounds are positive, abdomen soft and non-tender without masses or                  Hernia's noted. Msk:  Back normal, normal gait. Normal strength and tone for age.Pacemaker located in upper left chest.  Extremities: No clubbing, cyanosis or edema.  DP +1 Neuro: Alert and oriented X 3. Psych:  Good affect, responds appropriately, poor memory.  Labs:   Lab Results  Component Value Date   WBC 8.4 08/07/2011   HGB 14.0 08/07/2011   HCT 43.6 08/07/2011   MCV 87.4 08/07/2011   PLT 223 08/07/2011     Lab 08/07/11 2035  NA 137  K 3.9  CL 101  CO2 30  BUN 14  CREATININE 0.86  CALCIUM 9.7  PROT --  BILITOT --  ALKPHOS --  ALT --  AST --  GLUCOSE 294*   Lab Results  Component Value Date   CKTOTAL 36 08/08/2011   CKMB 2.7 08/08/2011   TROPONINI <0.30 08/08/2011        Radiology: Dg Chest Portable 1 View  08/07/2011  *RADIOLOGY REPORT*  Clinical Data: Dizziness.  Short of breath.  PORTABLE CHEST - 1 VIEW  IMPRESSION: Cardiomegaly.  No active cardiopulmonary disease.  Original Report Authenticated By: Donavan Burnet, M.D.   ZOX:WRUEA rhythm rate of 70 bpm.  1. Near syncope:  Multifactorial. Hypertension and hyperglycemia are probable causes.  Pacemaker was recently interrogated in August of this year and functioning appropriately.  Cardiac enzymes are negative so far. Doubt cardiac etiology of symptoms. Would recommend social services consult to evaluate home needs, as she complains that her children are not as helpful with her health needs.   2. Hypertension: Currently elevated this am. She is on valsartan 320 mg at home Benicar is on hospital formulary.  Will start at 40 mg daily. Monitor response.   ASSESSMENT AND PLAN:  Signed: Bettey Mare. Lyman Bishop NP [name] 08/08/2011, 8:24 AM Co-Sign MD

## 2011-08-08 NOTE — H&P (Signed)
[name], [name]               ACCOUNT NO.:  [id number]  MEDICAL RECORD NO.:  [id number]  LOCATION:  A325                          FACILITY:  [facility]  PHYSICIAN:  Maylyn Narvaiz G. Renard Matter, MD   DATE OF BIRTH:  11-12-1926  DATE OF ADMISSION:  08/07/2011 DATE OF DISCHARGE:  LH                             HISTORY & PHYSICAL   This 77 year old white female came into the emergency department with a chief complaint of headache and weakness and some dizziness.  The patient apparently did not have syncopal episode or altered mental status.  She became extremely agitated.  She lives most of the time alone at home.  Had generalized headache but no associated visual changes.  Does have generalized weakness.  The room was spinning.  She denied any recent fall or trauma.  No chest pain.  Some shortness of breath.  The EKG showed nonspecific ST changes and normal sinus rhythm.  PERTINENT LABS:  CBC:  WBC 8400, hemoglobin 14.0, hematocrit 43.6. Cardiac markers:  CK 36, CK-MB 2.7, troponin less than 0.30.  UA essentially negative.  The patient was subsequently admitted.  SOCIAL HISTORY:  The patient was a former cigarette smoker.  Does not use alcohol.  FAMILY HISTORY:  Positive for cancer in stomach in mother and father. Several family members had diabetes.  PAST MEDICAL HISTORY:  COPD, syncope, hypertension, hyperlipidemia, benign positional vertigo, history of shingles, diabetes mellitus, osteoporosis, second-degree Mobitz II AV block, previous history of MI.  PAST SURGICAL HISTORY:  Appendectomy; insertion, replacement and removal of pacemaker; cholecystectomy; abdominal hysterectomy; eye surgery; total knee arthroplasty.  REVIEW OF SYSTEMS:  HEENT:  Negative.  CARDIOPULMONARY:  The patient has some shortness of breath.  No chest pain.  GI:  Positive for diarrhea several days ago.  GU:  No dysuria but evidence of hematuria. NEUROLOGICAL:  Dizziness, weakness, headache.  ALLERGIES:  No known  drug allergies.  MEDICATION LIST: 1. Aspirin 325 mg as needed for pain. 2. Metformin hydrochloride 500 mg twice a day. 3. Pantoprazole sodium 40 mg daily. 4. Insulin glargine 30 units and to skin twice a day. 5. Valsartan 325 mg daily.  PHYSICAL EXAMINATION:  VITAL SIGNS:  Alert female with blood pressure 191/71, pulse 67. HEENT:  Eyes:  PERRLA.  TM negative.  Oropharynx benign. NECK:  Supple.  No JVD or thyroid abnormalities. HEART:  Regular rhythm. LUNGS:  Clear to P and A. ABDOMEN:  No palpable organs or masses.  No organomegaly. EXTREMITIES:  Free of edema. NEUROLOGICAL:  No focal deficit.  Cranial nerves intact.  ASSESSMENT:  The patient admitted with weakness, near syncope.  She does have an element of anxiety, possible depression.  The patient does have hypertension, hyperlipidemia, diabetes mellitus, second-degree atrioventricular block, previous myocardial infarction, coronary artery disease.  PLAN:  To continue to monitor the patient's status.  We will obtain cardiology consult as well.     Diamante Rubin G. Renard Matter, MD     AGM/MEDQ  D:  08/08/2011  T:  08/08/2011  Job:  [id number]

## 2011-08-09 LAB — GLUCOSE, CAPILLARY: Glucose-Capillary: 244 mg/dL — ABNORMAL HIGH (ref 70–99)

## 2011-08-09 MED ORDER — CLONIDINE HCL 0.1 MG PO TABS: 0.1000 mg | ORAL_TABLET | Freq: Every day | ORAL | Status: DC

## 2011-08-09 MED ORDER — CLONIDINE HCL 0.1 MG PO TABS
0.1000 mg | ORAL_TABLET | Freq: Two times a day (BID) | ORAL | Status: DC
  Administered 2011-08-09 – 2011-08-10 (×2): 0.1 mg via ORAL
  Filled 2011-08-09 (×2): qty 1

## 2011-08-09 MED ORDER — CLONIDINE HCL 0.1 MG PO TABS
0.1000 mg | ORAL_TABLET | Freq: Four times a day (QID) | ORAL | Status: DC | PRN
  Administered 2011-08-09: 0.1 mg via ORAL
  Filled 2011-08-09: qty 1

## 2011-08-09 NOTE — Progress Notes (Signed)
Inpatient Diabetes Program Recommendations  AACE/ADA: New Consensus Statement on Inpatient Glycemic Control (2009)  Target Ranges:  Prepandial:   less than 140 mg/dL      Peak postprandial:   less than 180 mg/dL (1-2 hours)      Critically ill patients:  140 - 180 mg/dL   Reason for Visit: Elevated glucose in 200s  Inpatient Diabetes Program Recommendations Insulin - Basal: Add home basal insulin:  Lantus 30 units BID HgbA1C: To assess glycemic control Diet: Add CHO modified medium to diet

## 2011-08-09 NOTE — Progress Notes (Signed)
Subjective:  Patient feeling much better. Patient denies any further presyncope.She says she was not taking care of herself at home. She plans on going to live with her son or granddaughter at discharge.  Objective:  Vital Signs in the last 24 hours: Temp:  [97.5 F (36.4 C)-98 F (36.7 C)] 97.5 F (36.4 C) (11/07 0528) Pulse Rate:  [53-72] 53  (11/07 0528) Resp:  [16-20] 16  (11/07 0528) BP: (133-185)/(72-91) 183/91 mmHg (11/07 0528) SpO2:  [92 %-95 %] 92 % (11/07 0528)  Intake/Output from previous day: 11/06 0701 - 11/07 0700 In: 1340 [P.O.:1340] Out: -  Intake/Output from this shift:    Physical Exam: NECK: Without JVD, HJR, + bilateral bruit LUNGS: Clear anterior, posterior, lateral HEART: Regular rate and rhythm, modest early systolic murmur, no gallop, rub, bruit, thrill, or heave EXTREMITIES: Without cyanosis, clubbing, or edema   Lab Results:  [facility] 08/07/11 2035  WBC 8.4  HGB 14.0  PLT 223    Basename 08/07/11 2035  NA 137  K 3.9  CL 101  CO2 30  GLUCOSE 294*  BUN 14  CREATININE 0.86    Basename 08/08/11 0523 08/07/11 2035  TROPONINI <0.30 <0.30   Assessment/Plan:   Near syncope: probably secondary to hypertension and hyperglycemia. Hypertension: Still elevated was on valsartan 320mg  at home. Will add clonidine daily instead of prn Pacemaker functioning normally Diabetes mellitus:uncontrolled  Jacolyn Reedy 08/09/2011, 8:49 AM   Cardiology Attending Patient interviewed and examined. Discussed with Herma Carson, PA-C.  Above note annotated and modified based upon my findings.  No hypotension or arrhythmia documented.  Treatment of hypertension and diabetes, both out of control, is proceeding.  No additional diagnostic Cardiology tests anticipated.  DC IV.  [place] Bing, MD

## 2011-08-09 NOTE — Progress Notes (Signed)
NAMEKIMORAH, RIDOLFI               ACCOUNT NO.:  [id number]  MEDICAL RECORD NO.:  [id number]  LOCATION:                                 FACILITY:  PHYSICIAN:  Cielle Aguila G. Renard Matter, MD   DATE OF BIRTH:  1927/05/30  DATE OF PROCEDURE: DATE OF DISCHARGE:                                PROGRESS NOTE   SUBJECTIVE:  This patient was admitted with a chief complaint of headache, weakness, and some dizziness.  EKG showed nonspecific ST changes and normal sinus rhythm.  The patient was seen in consultation by Cardiology yesterday, the presentation was specific for cardiac issues.  She does have elevated sugar and elevated blood pressure.  OBJECTIVE:  VITAL SIGNS:  Blood pressure this morning 183/91, respirations 16, pulse 53, temp 97.5. LUNGS:  Clear to P and A. HEART:  Regular rhythm. ABDOMEN:  No palpable organs or masses.  ASSESSMENT:  The patient was admitted with weakness, near syncope.  She does have diabetes and hypertension, hyperlipidemia, second-degree atrioventricular block, previous myocardial infarction.  PLAN:  Attempt to control her blood pressure with clonidine 0.1 mg every 4 hours as needed.  We will continue to monitor blood sugars.     Annissa Andreoni G. Renard Matter, MD     AGM/MEDQ  D:  08/09/2011  T:  08/09/2011  Job:  [id number]

## 2011-08-09 NOTE — Plan of Care (Signed)
Problem: Consults Goal: Diabetes Guidelines if Diabetic/Glucose > 140 If diabetic or lab glucose is > 140 mg/dl - Initiate Diabetes/Hyperglycemia Guidelines & Document Interventions  Outcome: Progressing Recommendations made for glycemic control.

## 2011-08-10 MED ORDER — CLONIDINE HCL 0.1 MG PO TABS: 0.1000 mg | ORAL_TABLET | Freq: Two times a day (BID) | ORAL | Status: DC

## 2011-08-10 NOTE — Progress Notes (Signed)
Discharge instructions given, verbalized understanding, out in stable condition with staff. 

## 2011-08-10 NOTE — Discharge Summary (Signed)
[name], [name]               ACCOUNT NO.:  [id number]  MEDICAL RECORD NO.:  [id number]  LOCATION:  A325                          FACILITY:  [facility]  PHYSICIAN:  Illyana Schorsch G. Renard Matter, MD   DATE OF BIRTH:  10/25/1926  DATE OF ADMISSION:  08/07/2011 DATE OF DISCHARGE:  11/07/2012LH                              DISCHARGE SUMMARY   This 77 year old female was admitted August 07, 2011, discharged August 09, 2011, 2 days hospitalization.  DIAGNOSES:  Near syncope, hypertension, hyperlipidemia, diabetes mellitus type 2, second degree atrioventricular block, previous myocardial infarction, coronary artery disease.  The patient's condition stable and improved at the time of her discharge.  This 77 year old white female came to the emergency department with chief complaint being headache, weakness, and dizziness.  She did not have syncopal episode or altered mental status.  She had become extremely agitated.  She lives most of the time at home alone, had generalized headache and no associated visual changes, but room was spinning she stated.  EKG showed nonspecific ST changes and normal sinus rhythm.  PHYSICAL EXAMINATION:  GENERAL:  Alert white female. VITAL SIGNS:  Blood pressure 191/71, pulse 67. HEENT:  Eyes:  PERRLA.  TMs negative.  Oropharynx benign. NECK:  Supple.  No JVD or thyroid abnormalities. HEART:  Regular rhythm. ABDOMEN:  No palpable organs or masses. EXTREMITIES:  Free of edema. NEUROLOGICAL:  No focal deficit.  Cranial nerves intact.  The patient admitted with weakness, near syncope.  Does have element of anxiety and possible depression.  Does have a history of hypertension, hyperlipidemia, diabetes mellitus, second-degree AV block, previous myocardial infarction, coronary artery disease.  LABORATORY DATA:  CBC on admission, WBC 8400 with a hemoglobin 14.0, hematocrit 43.6.  Glucoses remained from 170-244.  Cardiac panel remains within normal range.  Urinalysis  negative.  Chemistries on admission: Sodium 137, potassium 3.9, chloride 101, CO2 30, glucose 294, BUN 14, creatinine 0.86.  X-RAYS:  Chest x-ray, cardiomegaly, no acute cardiopulmonary process.  HOSPITAL COURSE:  The patient at the time of admission was placed on heart healthy diet.  She was given clonidine 0.1 mg b.i.d. and olmesartan 40 mg daily, insulin 3 units subcutaneously t.i.d.  The patient was seen in consultation by Cardiology who felt that there were no pauses or arrhythmias.  Cardiac markers were normal.  Reported history of coronary artery disease and MI was unclear.  No documented evidence of strokes in the past on record.  They felt that [name]'s presenting symptoms were vague describing anxiety, weakness, lightheadedness but no chest pain or syncope.  They felt the pacemaker was functioning normally.  Diabetes was uncontrolled.  The patient will be sent home on the following medications: 1. Aspirin 325 mg daily. 2. Lantus insulin 30 units b.i.d. 3. Metformin 500 mg b.i.d. 4. Pantoprazole 40 mg daily. 5. Valsartan 325 mg daily. 6. Clonidine 0.1 mg b.i.d. 7. Aspirin 325 mg daily.     [name] G. Renard Matter, MD     AGM/MEDQ  D:  08/10/2011  T:  08/10/2011  Job:  [id number]

## 2011-08-11 LAB — GLUCOSE, CAPILLARY
Glucose-Capillary: 263 mg/dL — ABNORMAL HIGH (ref 70–99)
Glucose-Capillary: 280 mg/dL — ABNORMAL HIGH (ref 70–99)
Glucose-Capillary: 205 mg/dL — ABNORMAL HIGH (ref 70–99)

## 2011-08-12 LAB — GLUCOSE, CAPILLARY: Glucose-Capillary: 252 mg/dL — ABNORMAL HIGH (ref 70–99)

## 2011-09-09 ENCOUNTER — Encounter (HOSPITAL_COMMUNITY): Payer: Self-pay

## 2011-09-09 ENCOUNTER — Inpatient Hospital Stay (HOSPITAL_COMMUNITY)
Admission: EM | Admit: 2011-09-09 | Discharge: 2011-09-14 | DRG: 074 | Disposition: A | Discharge status: Nursing Home (Includes ICF) | Payer: Medicare Other | Attending: Family Medicine | Admitting: Family Medicine

## 2011-09-09 ENCOUNTER — Other Ambulatory Visit: Payer: Self-pay

## 2011-09-09 ENCOUNTER — Emergency Department (HOSPITAL_COMMUNITY): Payer: Medicare Other

## 2011-09-09 DIAGNOSIS — F0390 Unspecified dementia without behavioral disturbance: Secondary | ICD-10-CM | POA: Clinically undetermined

## 2011-09-09 DIAGNOSIS — Z91199 Patient's noncompliance with other medical treatment and regimen due to unspecified reason: Secondary | ICD-10-CM

## 2011-09-09 DIAGNOSIS — Z950 Presence of cardiac pacemaker: Secondary | ICD-10-CM

## 2011-09-09 DIAGNOSIS — Z9119 Patient's noncompliance with other medical treatment and regimen: Secondary | ICD-10-CM

## 2011-09-09 DIAGNOSIS — I252 Old myocardial infarction: Secondary | ICD-10-CM

## 2011-09-09 DIAGNOSIS — N390 Urinary tract infection, site not specified: Secondary | ICD-10-CM | POA: Diagnosis present

## 2011-09-09 DIAGNOSIS — E785 Hyperlipidemia, unspecified: Secondary | ICD-10-CM | POA: Diagnosis present

## 2011-09-09 DIAGNOSIS — R627 Adult failure to thrive: Secondary | ICD-10-CM | POA: Diagnosis present

## 2011-09-09 DIAGNOSIS — E1149 Type 2 diabetes mellitus with other diabetic neurological complication: Principal | ICD-10-CM | POA: Diagnosis present

## 2011-09-09 DIAGNOSIS — J449 Chronic obstructive pulmonary disease, unspecified: Secondary | ICD-10-CM | POA: Diagnosis present

## 2011-09-09 DIAGNOSIS — M810 Age-related osteoporosis without current pathological fracture: Secondary | ICD-10-CM | POA: Diagnosis present

## 2011-09-09 DIAGNOSIS — I441 Atrioventricular block, second degree: Secondary | ICD-10-CM | POA: Diagnosis present

## 2011-09-09 DIAGNOSIS — J4489 Other specified chronic obstructive pulmonary disease: Secondary | ICD-10-CM | POA: Diagnosis present

## 2011-09-09 DIAGNOSIS — I2510 Atherosclerotic heart disease of native coronary artery without angina pectoris: Secondary | ICD-10-CM | POA: Diagnosis present

## 2011-09-09 DIAGNOSIS — K3184 Gastroparesis: Secondary | ICD-10-CM | POA: Diagnosis present

## 2011-09-09 DIAGNOSIS — E119 Type 2 diabetes mellitus without complications: Secondary | ICD-10-CM

## 2011-09-09 HISTORY — DX: Shortness of breath: R06.02

## 2011-09-09 LAB — CBC
Platelets: 214 10*3/uL (ref 150–400)
RBC: 4.86 MIL/uL (ref 3.87–5.11)
WBC: 11.7 10*3/uL — ABNORMAL HIGH (ref 4.0–10.5)

## 2011-09-09 LAB — URINALYSIS, ROUTINE W REFLEX MICROSCOPIC
Glucose, UA: >1000 mg/dL — AB
pH: 6 (ref 5.0–8.0)

## 2011-09-09 LAB — BASIC METABOLIC PANEL
CO2: 27 mEq/L (ref 19–32)
Calcium: 11 mg/dL — ABNORMAL HIGH (ref 8.4–10.5)
Chloride: 99 mEq/L (ref 96–112)
Sodium: 138 mEq/L (ref 135–145)

## 2011-09-09 LAB — GLUCOSE, CAPILLARY
Glucose-Capillary: 385 mg/dL — ABNORMAL HIGH (ref 70–99)
Glucose-Capillary: 359 mg/dL — ABNORMAL HIGH (ref 70–99)
Glucose-Capillary: 153 mg/dL — ABNORMAL HIGH (ref 70–99)

## 2011-09-09 LAB — URINE MICROSCOPIC-ADD ON

## 2011-09-09 MED ORDER — ASPIRIN EC 325 MG PO TBEC
325.0000 mg | DELAYED_RELEASE_TABLET | Freq: Every day | ORAL | Status: DC
  Administered 2011-09-09 – 2011-09-14 (×6): 325 mg via ORAL
  Filled 2011-09-09 (×7): qty 1

## 2011-09-09 MED ORDER — OLMESARTAN MEDOXOMIL 20 MG PO TABS
40.0000 mg | ORAL_TABLET | Freq: Every day | ORAL | Status: DC
  Administered 2011-09-09 – 2011-09-14 (×6): 40 mg via ORAL
  Filled 2011-09-09 (×6): qty 2

## 2011-09-09 MED ORDER — CLONIDINE HCL 0.1 MG PO TABS
0.1000 mg | ORAL_TABLET | Freq: Two times a day (BID) | ORAL | Status: DC
  Administered 2011-09-09 – 2011-09-14 (×11): 0.1 mg via ORAL
  Filled 2011-09-09 (×11): qty 1

## 2011-09-09 MED ORDER — INFLUENZA VIRUS VACC SPLIT PF IM SUSP
0.5000 mL | INTRAMUSCULAR | Status: AC
  Administered 2011-09-10: 0.5 mL via INTRAMUSCULAR
  Filled 2011-09-09: qty 0.5

## 2011-09-09 MED ORDER — SODIUM CHLORIDE 0.9 % IJ SOLN: 3.0000 mL | INTRAMUSCULAR | Status: DC | PRN

## 2011-09-09 MED ORDER — ALBUTEROL SULFATE (5 MG/ML) 0.5% IN NEBU
INHALATION_SOLUTION | RESPIRATORY_TRACT | Status: AC
  Administered 2011-09-09: 5 mg
  Filled 2011-09-09: qty 1

## 2011-09-09 MED ORDER — CEFTRIAXONE SODIUM 1 G IJ SOLR
1.0000 g | INTRAMUSCULAR | Status: DC
  Administered 2011-09-10 – 2011-09-14 (×5): 1 g via INTRAVENOUS
  Filled 2011-09-09 (×6): qty 10

## 2011-09-09 MED ORDER — INSULIN ASPART 100 UNIT/ML ~~LOC~~ SOLN: 0.0000 [IU] | Freq: Three times a day (TID) | SUBCUTANEOUS | Status: DC

## 2011-09-09 MED ORDER — DEXTROSE 5 % IV SOLN
1.0000 g | Freq: Once | INTRAVENOUS | Status: AC
  Administered 2011-09-09: 1 g via INTRAVENOUS
  Filled 2011-09-09: qty 10

## 2011-09-09 MED ORDER — INSULIN ASPART 100 UNIT/ML ~~LOC~~ SOLN
3.0000 [IU] | Freq: Once | SUBCUTANEOUS | Status: AC
  Administered 2011-09-09: 3 [IU] via SUBCUTANEOUS

## 2011-09-09 MED ORDER — METHYLPREDNISOLONE SODIUM SUCC 125 MG IJ SOLR
125.0000 mg | Freq: Once | INTRAMUSCULAR | Status: AC
  Administered 2011-09-09: 125 mg via INTRAVENOUS
  Filled 2011-09-09: qty 2

## 2011-09-09 MED ORDER — SODIUM CHLORIDE 0.9 % IV BOLUS (SEPSIS)
500.0000 mL | Freq: Once | INTRAVENOUS | Status: AC
  Administered 2011-09-09: 03:00:00 via INTRAVENOUS

## 2011-09-09 MED ORDER — IPRATROPIUM BROMIDE 0.02 % IN SOLN
RESPIRATORY_TRACT | Status: AC
  Administered 2011-09-09: 0.5 mg
  Filled 2011-09-09: qty 2.5

## 2011-09-09 MED ORDER — DIPHENHYDRAMINE HCL 25 MG PO CAPS
25.0000 mg | ORAL_CAPSULE | ORAL | Status: DC | PRN
  Administered 2011-09-09 – 2011-09-13 (×3): 25 mg via ORAL
  Filled 2011-09-09 (×3): qty 1

## 2011-09-09 MED ORDER — PANTOPRAZOLE SODIUM 40 MG PO TBEC
40.0000 mg | DELAYED_RELEASE_TABLET | Freq: Every day | ORAL | Status: DC
  Administered 2011-09-09 – 2011-09-14 (×6): 40 mg via ORAL
  Filled 2011-09-09 (×6): qty 1

## 2011-09-09 MED ORDER — SERTRALINE HCL 50 MG PO TABS
25.0000 mg | ORAL_TABLET | Freq: Every day | ORAL | Status: DC
  Administered 2011-09-09 – 2011-09-14 (×6): 25 mg via ORAL
  Filled 2011-09-09: qty 1
  Filled 2011-09-09: qty 2
  Filled 2011-09-09 (×4): qty 1

## 2011-09-09 MED ORDER — SODIUM CHLORIDE 0.9 % IJ SOLN
INTRAMUSCULAR | Status: AC
  Filled 2011-09-09: qty 3

## 2011-09-09 MED ORDER — INSULIN ASPART 100 UNIT/ML ~~LOC~~ SOLN
0.0000 [IU] | Freq: Three times a day (TID) | SUBCUTANEOUS | Status: DC
  Administered 2011-09-09 (×3): 15 [IU] via SUBCUTANEOUS
  Administered 2011-09-10: 5 [IU] via SUBCUTANEOUS
  Administered 2011-09-10 – 2011-09-11 (×3): 8 [IU] via SUBCUTANEOUS
  Administered 2011-09-11 – 2011-09-12 (×2): 3 [IU] via SUBCUTANEOUS
  Administered 2011-09-12: 11 [IU] via SUBCUTANEOUS
  Administered 2011-09-12: 8 [IU] via SUBCUTANEOUS
  Administered 2011-09-13: 5 [IU] via SUBCUTANEOUS
  Administered 2011-09-13: 8 [IU] via SUBCUTANEOUS
  Administered 2011-09-13: 5 [IU] via SUBCUTANEOUS
  Administered 2011-09-14: 11 [IU] via SUBCUTANEOUS
  Administered 2011-09-14: 5 [IU] via SUBCUTANEOUS
  Filled 2011-09-09 (×2): qty 3

## 2011-09-09 MED ORDER — SODIUM CHLORIDE 0.9 % IJ SOLN
3.0000 mL | Freq: Two times a day (BID) | INTRAMUSCULAR | Status: DC
  Administered 2011-09-10 – 2011-09-13 (×4): 3 mL via INTRAVENOUS
  Filled 2011-09-09 (×5): qty 3

## 2011-09-09 MED ORDER — FLUTICASONE-SALMETEROL 250-50 MCG/DOSE IN AEPB
1.0000 | INHALATION_SPRAY | Freq: Two times a day (BID) | RESPIRATORY_TRACT | Status: DC
  Administered 2011-09-09 – 2011-09-14 (×10): 1 via RESPIRATORY_TRACT
  Filled 2011-09-09: qty 14

## 2011-09-09 MED ORDER — ENOXAPARIN SODIUM 40 MG/0.4ML ~~LOC~~ SOLN
40.0000 mg | SUBCUTANEOUS | Status: DC
  Administered 2011-09-09 – 2011-09-13 (×5): 40 mg via SUBCUTANEOUS
  Filled 2011-09-09 (×6): qty 0.4

## 2011-09-09 MED ORDER — SODIUM CHLORIDE 0.9 % IV SOLN
250.0000 mL | INTRAVENOUS | Status: DC | PRN
  Administered 2011-09-09: 250 mL via INTRAVENOUS
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
## 2011-09-09 NOTE — ED Provider Notes (Signed)
History     CSN: [id number] Arrival date & time: 09/09/2011 12:18 AM   First MD Initiated Contact with Patient 09/09/11 0022      Chief Complaint  Patient presents with  . Asthma    (Consider location/radiation/quality/duration/timing/severity/associated sxs/prior treatment) Patient is a 77 y.o. female presenting with asthma. The history is provided by the EMS personnel, a relative and the patient. The history is limited by the condition of the patient. No language interpreter was used.  Asthma This is a recurrent problem. The symptoms are aggravated by nothing. The symptoms are relieved by nothing. She has tried nothing for the symptoms. The treatment provided no relief.  Patient states she is out of all her medications and her family refuses to take her to her doctors appointments.  Family arrived and stated patient is not taking her meds refusing to go to MD appointments and is paranoids and not acting like herself.  She has had a reported 70 lb weight loss.  They fear she has dementia ans state she is no longer able to care for herself.    Past Medical History  Diagnosis Date  . COPD (chronic obstructive pulmonary disease)   . Syncope   . Essential hypertension, benign   . Hyperlipidemia   . Benign positional vertigo   . History of shingles   . Type 2 diabetes mellitus   . Osteoporosis   . Second degree Mobitz II AV block     s/p PPM 11/02/09  . Myocardial infarction   . Near syncope 09/09/2009    Past Surgical History  Procedure Date  . Appendectomy   . Insert / replace / remove pacemaker 11/02/09    S/p Medtronic PPM - Dr. Johney Frame  . Cholecystectomy   . Abdominal hysterectomy   . Eye surgery   . Total knee arthroplasty 1980's    Left    Family History  Problem Relation Age of Onset  . Cancer Mother     Stomach  . Cancer Father     Stomach  . Heart disease Neg Hx     Nor sudden cardiac death  . Diabetes Other     Several family members  . Diabetes Sister   .  Diabetes Brother   . Diabetes Sister     History  Substance Use Topics  . Smoking status: Former Smoker -- 30 years  . Smokeless tobacco: Not on file  . Alcohol Use: No    OB History    Grav Para Term Preterm Abortions TAB SAB Ect Mult Living                  Review of Systems  Unable to perform ROS   Allergies  Review of patient's allergies indicates no known allergies.  Home Medications   Current Outpatient Rx  Name Route Sig Dispense Refill  . ASPIRIN EC 325 MG PO TBEC Oral Take 325 mg by mouth as needed. For pain    . CLONIDINE HCL 0.1 MG PO TABS Oral Take 1 tablet (0.1 mg total) by mouth 2 (two) times daily. 60 tablet   . FLUTICASONE-SALMETEROL 250-50 MCG/DOSE IN AEPB Inhalation Inhale 1 puff into the lungs every 12 (twelve) hours.      . INSULIN GLARGINE 100 UNIT/ML [place] SOLN Subcutaneous Inject 30 Units into the skin 2 (two) times daily.      Marland Kitchen METFORMIN HCL 500 MG PO TABS Oral Take 500 mg by mouth 2 (two) times daily.      Marland Kitchen  PANTOPRAZOLE SODIUM 40 MG PO TBEC Oral Take 40 mg by mouth daily.      . SERTRALINE HCL 25 MG PO TABS Oral Take 25 mg by mouth daily.      Marland Kitchen VALSARTAN 320 MG PO TABS Oral Take 320 mg by mouth daily.        BP 169/57  Pulse 85  Temp 98 F (36.7 C)  Resp 20  Ht 5\' 6"  (1.676 m)  Wt 180 lb (81.647 kg)  BMI 29.05 kg/m2  SpO2 94%  Physical Exam  Nursing note and vitals reviewed. Constitutional: She appears well-developed and well-nourished.  HENT:  Head: Normocephalic and atraumatic.  Right Ear: External ear normal.  Left Ear: External ear normal.  Mouth/Throat: Oropharynx is clear and moist. No oropharyngeal exudate.  Eyes: Conjunctivae and EOM are normal. Pupils are equal, round, and reactive to light.  Neck: Normal range of motion. Neck supple. No JVD present.  Cardiovascular: Normal rate and regular rhythm.   Pulmonary/Chest: No stridor. She has wheezes.  Abdominal: Soft. Bowel sounds are normal. There is no tenderness. There is no  rebound and no guarding.  Musculoskeletal: She exhibits no edema.  Lymphadenopathy:    She has no cervical adenopathy.  Neurological: She is alert. She has normal reflexes.  Skin: Skin is warm and dry. She is not diaphoretic.  Psychiatric: She has a normal mood and affect.    ED Course  Procedures (including critical care time)  Labs Reviewed  CBC - Abnormal; Notable for the following:    WBC 11.7 (*)    All other components within normal limits  BASIC METABOLIC PANEL - Abnormal; Notable for the following:    Glucose, Bld 374 (*)    BUN 32 (*)    Calcium 11.0 (*)    GFR calc non Af Amer 49 (*)    GFR calc Af Amer 56 (*)    All other components within normal limits  URINALYSIS, ROUTINE W REFLEX MICROSCOPIC - Abnormal; Notable for the following:    APPearance HAZY (*)    Glucose, UA >1000 (*)    Hgb urine dipstick TRACE (*)    Protein, ur TRACE (*)    Leukocytes, UA TRACE (*)    All other components within normal limits  URINE MICROSCOPIC-ADD ON - Abnormal; Notable for the following:    Bacteria, UA MANY (*)    All other components within normal limits  POCT I-STAT TROPONIN I  I-STAT TROPONIN I   Dg Chest 2 View  09/09/2011  *RADIOLOGY REPORT*  Clinical Data: Wheezing.  Indwelling pacemaker.  CHEST - 2 VIEW 09/09/2011:  Comparison: Portable chest x-ray 08/07/2011, 05/01/2011, 10/14/2010 [facility].  Two-view chest x-ray 11/02/2009 [facility].  Findings: Suboptimal inspiration accounts for crowded bronchovascular markings, especially in the lung bases, and accentuates the cardiac silhouette.  Taking this into account, cardiac silhouette upper normal in size to slightly enlarged and lungs clear.  Thoracic aorta tortuous and atherosclerotic, unchanged.  Prominent central pulmonary arteries, unchanged.  No pleural effusions.  Mild degenerative changes involving the thoracic spine.  Left subclavian dual lead transvenous pacemaker unchanged and intact.  IMPRESSION:  Suboptimal inspiration.  Stable borderline heart size.  No acute cardiopulmonary disease.  Original Report Authenticated By: Arnell Sieving, M.D.    Results for orders placed during the hospital encounter of 09/09/11  CBC      Component Value Range   WBC 11.7 (*) 4.0 - 10.5 (K/uL)   RBC 4.86  3.87 -  5.11 (MIL/uL)   Hemoglobin 13.7  12.0 - 15.0 (g/dL)   HCT 16.1  09.6 - 04.5 (%)   MCV 89.7  78.0 - 100.0 (fL)   MCH 28.2  26.0 - 34.0 (pg)   MCHC 31.4  30.0 - 36.0 (g/dL)   RDW 40.9  81.1 - 91.4 (%)   Platelets 214  150 - 400 (K/uL)  BASIC METABOLIC PANEL      Component Value Range   Sodium 138  135 - 145 (mEq/L)   Potassium 4.8  3.5 - 5.1 (mEq/L)   Chloride 99  96 - 112 (mEq/L)   CO2 27  19 - 32 (mEq/L)   Glucose, Bld 374 (*) 70 - 99 (mg/dL)   BUN 32 (*) 6 - 23 (mg/dL)   Creatinine, Ser 7.82  0.50 - 1.10 (mg/dL)   Calcium 95.6 (*) 8.4 - 10.5 (mg/dL)   GFR calc non Af Amer 49 (*) >90 (mL/min)   GFR calc Af Amer 56 (*) >90 (mL/min)  URINALYSIS, ROUTINE W REFLEX MICROSCOPIC      Component Value Range   Color, Urine YELLOW  YELLOW    APPearance HAZY (*) CLEAR    Specific Gravity, Urine 1.020  1.005 - 1.030    pH 6.0  5.0 - 8.0    Glucose, UA >1000 (*) NEGATIVE (mg/dL)   Hgb urine dipstick TRACE (*) NEGATIVE    Bilirubin Urine NEGATIVE  NEGATIVE    Ketones, ur NEGATIVE  NEGATIVE (mg/dL)   Protein, ur TRACE (*) NEGATIVE (mg/dL)   Urobilinogen, UA 0.2  0.0 - 1.0 (mg/dL)   Nitrite NEGATIVE  NEGATIVE    Leukocytes, UA TRACE (*) NEGATIVE   URINE MICROSCOPIC-ADD ON      Component Value Range   Squamous Epithelial / LPF RARE  RARE    WBC, UA TOO NUMEROUS TO COUNT  <3 (WBC/hpf)   RBC / HPF 0-2  <3 (RBC/hpf)   Bacteria, UA MANY (*) RARE   POCT I-STAT TROPONIN I      Component Value Range   Troponin i, poc 0.02  0.00 - 0.08 (ng/mL)   Comment 3            Dg Chest 2 View  09/09/2011  *RADIOLOGY REPORT*  Clinical Data: Wheezing.  Indwelling pacemaker.  CHEST - 2 VIEW 09/09/2011:   Comparison: Portable chest x-ray 08/07/2011, 05/01/2011, 10/14/2010 [facility].  Two-view chest x-ray 11/02/2009 [facility].  Findings: Suboptimal inspiration accounts for crowded bronchovascular markings, especially in the lung bases, and accentuates the cardiac silhouette.  Taking this into account, cardiac silhouette upper normal in size to slightly enlarged and lungs clear.  Thoracic aorta tortuous and atherosclerotic, unchanged.  Prominent central pulmonary arteries, unchanged.  No pleural effusions.  Mild degenerative changes involving the thoracic spine.  Left subclavian dual lead transvenous pacemaker unchanged and intact.  IMPRESSION: Suboptimal inspiration.  Stable borderline heart size.  No acute cardiopulmonary disease.  Original Report Authenticated By: Arnell Sieving, M.D.    No diagnosis found.    MDM   Date: 09/09/2011  Rate: 94  Rhythm: normal sinus rhythm  QRS Axis: normal  Intervals: PR prolonged  ST/T Wave abnormalities: normal  Conduction Disutrbances:first-degree A-V block   Narrative Interpretation:   Old EKG Reviewed: changes noted   MDM Reviewed: nursing note and vitals Interpretation: labs, ECG and x-ray Consults: admitting MD         Kyoko Elsea K Azariel Banik-Rasch, MD 09/09/11 [phone]

## 2011-09-09 NOTE — H&P (Signed)
PCP:   mckinnis  Chief Complaint:  Waxing and waning confusion for over 6 months  HPI: [name] is an 77 year old female who has questionable history of dementia, BPV, hypertension, diabetes, who comes to the ED after worsening confusion that was noted by her family members. She has a son and daughter-in-law that live next door to her and they say that she's been having periods of on and off confusion that has been going on for over 6 months and seems to be getting worse lately. She has not been diagnosed with dementia formerly. She's gotten to the point where she can no longer give herself her medications safely. They have tried putting her her pills in a pillbox for her daily and she still gets this confused and is taking double one by mouth and not enough of the other it's really getting very confusing for her to give herself for all medications. She does live alone. She says that she can see the bottle but she has a hard time reading the bottle. Currently she seems very alert and oriented and competent. She denies any nausea vomiting diarrhea any recent fevers or dysuria. She does say that she's been having some hematuria but this is gotten better over the last several days.  Review of Systems:  Otherwise negative  Past Medical History: Past Medical History  Diagnosis Date  . COPD (chronic obstructive pulmonary disease)   . Syncope   . Essential hypertension, benign   . Hyperlipidemia   . Benign positional vertigo   . History of shingles   . Type 2 diabetes mellitus   . Osteoporosis   . Second degree Mobitz II AV block     s/p PPM 11/02/09  . Myocardial infarction   . Near syncope 09/09/2009  . Asthma   . Shortness of breath    Past Surgical History  Procedure Date  . Appendectomy   . Insert / replace / remove pacemaker 11/02/09    S/p Medtr[employer] PPM - Dr. Johney Frame  . Cholecystectomy   . Abdominal hysterectomy   . Eye surgery   . Total knee arthroplasty 1980's    Left     Medications: Prior to Admission medications   Medication Sig Start Date End Date Taking? Authorizing Provider  aspirin EC 325 MG tablet Take 325 mg by mouth as needed. For pain    Historical Provider, MD  cloNIDine (CATAPRES) 0.1 MG tablet Take 1 tablet (0.1 mg total) by mouth 2 (two) times daily. 08/10/11 08/09/12  Angus G McInnis  Fluticasone-Salmeterol (ADVAIR) 250-50 MCG/DOSE AEPB Inhale 1 puff into the lungs every 12 (twelve) hours.      Historical Provider, MD  insulin glargine (LANTUS) 100 UNIT/ML injection Inject 30 Units into the skin 2 (two) times daily.      Historical Provider, MD  metFORMIN (GLUCOPHAGE) 500 MG tablet Take 500 mg by mouth 2 (two) times daily.      Historical Provider, MD  pantoprazole (PROTONIX) 40 MG tablet Take 40 mg by mouth daily.      Historical Provider, MD  sertraline (ZOLOFT) 25 MG tablet Take 25 mg by mouth daily.      Historical Provider, MD  valsartan (DIOVAN) 320 MG tablet Take 320 mg by mouth daily.      Historical Provider, MD    Allergies:  No Known Allergies  Social History:  reports that she has quit smoking. She does not have any smokeless tobacco history on file. She reports that she does not drink  alcohol or use illicit drugs.  Family History: Family History  Problem Relation Age of Onset  . Cancer Mother     Stomach  . Cancer Father     Stomach  . Heart disease Neg Hx     Nor sudden cardiac death  . Diabetes Other     Several family members  . Diabetes Sister   . Diabetes Brother   . Diabetes Sister     Physical Exam: Filed Vitals:   09/09/11 0035 09/09/11 0251 09/09/11 0304 09/09/11 0502  BP:  169/57  156/61  Pulse:  85  84  Temp:  98 F (36.7 C)  97.7 F (36.5 C)  TempSrc:    Oral  Resp:  20  24  Height:    5\' 6"  (1.676 m)  Weight:    81.5 kg (179 lb 10.8 oz)  SpO2: 96% 96% 94% 95%   General appearance: alert, cooperative and no distress Resp: clear to auscultation bilaterally Cardio: regular rate and rhythm,  S1, S2 normal, no murmur, click, rub or gallop GI: soft, non-tender; bowel sounds normal; no masses,  no organomegaly Extremities: extremities normal, atraumatic, no cyanosis or edema Pulses: 2+ and symmetric Skin: Skin color, texture, turgor normal. No rashes but with multiple excoriations on arms and abdomen Neurologic: Grossly normal   Labs on Admission:   [facility] 09/09/11 0128  NA 138  K 4.8  CL 99  CO2 27  GLUCOSE 374*  BUN 32*  CREATININE 1.04  CALCIUM 11.0*  MG --  PHOS --    Basename 09/09/11 0128  WBC 11.7*  NEUTROABS --  HGB 13.7  HCT 43.6  MCV 89.7  PLT 214     Radiological Exams on Admission: Dg Chest 2 View  09/09/2011  *RADIOLOGY REPORT*  Clinical Data: Wheezing.  Indwelling pacemaker.  CHEST - 2 VIEW 09/09/2011:  Comparison: Portable chest x-ray 08/07/2011, 05/01/2011, 10/14/2010 [facility].  Two-view chest x-ray 11/02/2009 [facility].  Findings: Suboptimal inspiration accounts for crowded bronchovascular markings, especially in the lung bases, and accentuates the cardiac silhouette.  Taking this into account, cardiac silhouette upper normal in size to slightly enlarged and lungs clear.  Thoracic aorta tortuous and atherosclerotic, unchanged.  Prominent central pulmonary arteries, unchanged.  No pleural effusions.  Mild degenerative changes involving the thoracic spine.  Left subclavian dual lead transvenous pacemaker unchanged and intact.  IMPRESSION: Suboptimal inspiration.  Stable borderline heart size.  No acute cardiopulmonary disease.  Original Report Authenticated By: Arnell Sieving, M.D.    Assessment/Plan Present on Admission:  77 year old female with confusion that seems to be going on for over 6 months. Now with recent worsening  .UTI (lower urinary tract infection) urine culture has been sent off we'll place on Rocephin she does not have frequent infections according to the family and the patient.  . diabetes uncontrolled  place on sliding scale insulin Hypertension Place her back on her home medication regimen Question underlying dementia I had a long discussion with her son and her daughter-in-law and the patient. I discussed options including assisted living versus private sitter at home versus her living with one of her children of which she has 5 versus skilled nursing facility. The patient is really not agreeable to any of these options except living at home. I will obtain a social work consult to go over these options with the family to check out local assisted living facilities as this seems to be the one option that the patient is most  open to doing. Dr. Renard Matter did arrange for home health to come into her house already been started 2 days ago. Further recommendations pending her overall course.  Santita Hunsberger A 09/09/2011, 5:47 AM

## 2011-09-09 NOTE — Progress Notes (Signed)
Patient was brought up to unit at 0430. Patient is alert and oriented, but has memory impairment and family states patient has paranoia. Patient is on 2L of oxygen. Patient has scabs all over her arms, legs, back, abdomen, and bottom. Patient states that she is itchy all the time. Patient's breathing is unlabored and regular. Family at the bedside.

## 2011-09-09 NOTE — ED Notes (Signed)
resp paged

## 2011-09-09 NOTE — ED Notes (Signed)
Room 335, [name]

## 2011-09-09 NOTE — Progress Notes (Signed)
NAMEKONSTANCE, HAPPEL               ACCOUNT NO.:  [id number]  MEDICAL RECORD NO.:  [id number]  LOCATION:  A335                          FACILITY:  [facility]  PHYSICIAN:  Kayhan Boardley G. Renard Matter, MD   DATE OF BIRTH:  [birth year]  DATE OF PROCEDURE: DATE OF DISCHARGE:                                PROGRESS NOTE   This patient developed mental confusion at home.  She states she was nauseous and began vomiting.  She has been confused about her medication.  Apparently she does not see well, has difficulty dosing herself.  Apparently she has been having episodes of hematuria as well as.  She does have additional problems of COPD, type 2 diabetes, previous history of coronary artery disease.  PHYSICAL EXAMINATION:  GENERAL:  This morning she is more alert. VITAL SIGNS:  Her blood pressure 181/92, respirations 24, pulse 84, temp 97.7.  LUNGS:  Clear.  Clear to P and A. HEART:  Regular rhythm.  No murmurs. ABDOMEN:  No palpable organs or masses. EXTREMITIES:  Free of edema. SKIN:  Warm and dry. NEUROLOGICAL:  Grossly normal.  ASSESSMENT:  The patient was admitted after having episodes of vomiting prior to admission.  She does have diabetes with elevated blood sugars, urinary tract infections, possible underlying dementia.  PLAN:  To continue current regimen.  Continue IV Rocephin, clonidine 0.1 mg b.i.d., IV normal saline.  Continue to monitor blood sugars.     Maxxwell Edgett G. Renard Matter, MD     AGM/MEDQ  D:  09/09/2011  T:  09/09/2011  Job:  [id number]

## 2011-09-09 NOTE — ED Notes (Signed)
1. Having asthma attack. 2. Out of meds for bp, dm, and inhaler, also no advair

## 2011-09-10 LAB — GLUCOSE, CAPILLARY
Glucose-Capillary: 243 mg/dL — ABNORMAL HIGH (ref 70–99)
Glucose-Capillary: 283 mg/dL — ABNORMAL HIGH (ref 70–99)

## 2011-09-10 MED ORDER — INSULIN DETEMIR 100 UNIT/ML ~~LOC~~ SOLN
10.0000 [IU] | Freq: Every day | SUBCUTANEOUS | Status: DC
  Administered 2011-09-10 – 2011-09-11 (×2): 10 [IU] via SUBCUTANEOUS
  Filled 2011-09-10: qty 3
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
## 2011-09-10 NOTE — Progress Notes (Signed)
Patient very itchy tonight from all of the spots on her body. RN notified doctor. New order for Benadryl was given.

## 2011-09-10 NOTE — Progress Notes (Signed)
Patient complaining of constipation, Patient has gotten up twice because she feels like she needs to have a bowel movement. Also, patient has been walking to the bathroom with one assist, and after the walk, patient starts having auditory wheezing, but oxygen level stayed at 97% on 2L

## 2011-09-10 NOTE — Progress Notes (Signed)
[name], [name]               ACCOUNT NO.:  [id number]  MEDICAL RECORD NO.:  [id number]  LOCATION:  A335                          FACILITY:  [facility]  PHYSICIAN:  Marikay Roads G. Renard Matter, MD   DATE OF BIRTH:  1926/12/26  DATE OF PROCEDURE: DATE OF DISCHARGE:                                PROGRESS NOTE   This patient has had no further vomiting, appears to be more alert.  Her blood sugars still run in 300 range.  She is on sliding scale NovoLog insulin.  She does have additional problems of COPD and history of coronary artery disease.  OBJECTIVE:  VITAL SIGNS:  Blood pressure 180/75, respirations 16, pulse 64, and temp 97.8. LUNGS:  Clear to P and A. HEART:  Regular rhythm. ABDOMEN:  No palpable organs or masses. EXTREMITIES:  Free of edema. SKIN:  Warm and dry. NEUROLOGIC:  Grossly normal.  ASSESSMENT:  The patient was admitted after having episodes of vomiting prior to admission.  She does have possible underlying dementia.  Does have insulin-dependent diabetes and possibly urinary tract infection.  PLAN:  To continue her current meds.  We will change to basal insulin as well as a sliding scale insulin.     Bon Dowis G. Renard Matter, MD     AGM/MEDQ  D:  09/10/2011  T:  09/10/2011  Job:  [id number]

## 2011-09-10 NOTE — Progress Notes (Signed)
Pt's family states her home has been infested with bed bugs.  The house was thoroughly cleaned today.  Pt has multiple scabbed scratch marks to extremities and torso.

## 2011-09-11 LAB — GLUCOSE, CAPILLARY
Glucose-Capillary: 174 mg/dL — ABNORMAL HIGH (ref 70–99)
Glucose-Capillary: 201 mg/dL — ABNORMAL HIGH (ref 70–99)

## 2011-09-11 MED ORDER — GLUCERNA SHAKE PO LIQD
237.0000 mL | Freq: Two times a day (BID) | ORAL | Status: DC
  Administered 2011-09-11 – 2011-09-13 (×6): 237 mL via ORAL

## 2011-09-11 MED ORDER — HYDROCODONE-HOMATROPINE 5-1.5 MG/5ML PO SYRP
5.0000 mL | ORAL_SOLUTION | ORAL | Status: DC | PRN
  Administered 2011-09-11 – 2011-09-13 (×2): 5 mL via ORAL
  Filled 2011-09-11 (×2): qty 5

## 2011-09-11 MED ORDER — AMLODIPINE BESYLATE 5 MG PO TABS
5.0000 mg | ORAL_TABLET | Freq: Every day | ORAL | Status: DC
  Administered 2011-09-11 – 2011-09-14 (×4): 5 mg via ORAL
  Filled 2011-09-11 (×4): qty 1

## 2011-09-11 NOTE — Progress Notes (Signed)
Inpatient Diabetes Program Recommendations  AACE/ADA: New Consensus Statement on Inpatient Glycemic Control (2009)  Target Ranges:  Prepandial:   less than 140 mg/dL      Peak postprandial:   less than 180 mg/dL (1-2 hours)      Critically ill patients:  140 - 180 mg/dL   Reason for Visit: Elevated fasting glucose 251 mg/dL  Inpatient Diabetes Program Recommendations Insulin - Basal: Increase Levemir to 20 units daily HgbA1C: Check HgbA1C: To assess glycemic control

## 2011-09-11 NOTE — Progress Notes (Signed)
UR Chart Review Completed  

## 2011-09-11 NOTE — Progress Notes (Signed)
NAMELEOMIA, BLAKE               ACCOUNT NO.:  [id number]  MEDICAL RECORD NO.:  [id number]  LOCATION:  A335                          FACILITY:  [facility]  PHYSICIAN:  Jaecob Lowden G. Renard Matter, MD   DATE OF BIRTH:  1927/09/26  DATE OF PROCEDURE: DATE OF DISCHARGE:                                PROGRESS NOTE   SUBJECTIVE:  This patient has had no further vomiting.  Appears to be more alert.  Her blood sugars are in the better range from 191-283.  She does have a history of coronary artery disease and COPD.  OBJECTIVE:  VITAL SIGNS:  Blood pressure 183/96, respirations 20, pulse 62, temp 97.5. LUNGS:  Occasional rhonchus heard over lower lung field. ABDOMEN:  No palpable organs or masses. SKIN:  Warm and dry. NEUROLOGICAL:  Grossly normal.  ASSESSMENT:  The patient was admitted with episodes of vomiting.  She does have diabetes, elevated blood sugar, underlying dementia.  PLAN:  Continue current regimen.  We will add Norvasc to regimen.     Kalee Mcclenathan G. Renard Matter, MD     AGM/MEDQ  D:  09/11/2011  T:  09/11/2011  Job:  [id number]

## 2011-09-11 NOTE — Progress Notes (Signed)
Physical Therapy Evaluation Patient Details Name: [name] MRN: [id number] DOB: [birth year] Today's Date: 09/11/2011  Problem List:  Patient Active Problem List  Diagnoses  . DM  . MIXED HYPERLIPIDEMIA  . HYPERTENSION  . Near syncope  . CHEST PAIN  . Second degree Mobitz II AV block  . UTI (lower urinary tract infection)  . FTT (failure to thrive) in adult  . Medically noncompliant  . Dementia    Past Medical History:  Past Medical History  Diagnosis Date  . COPD (chronic obstructive pulmonary disease)   . Syncope   . Essential hypertension, benign   . Hyperlipidemia   . Benign positional vertigo   . History of shingles   . Type 2 diabetes mellitus   . Osteoporosis   . Second degree Mobitz II AV block     s/p PPM 11/02/09  . Myocardial infarction   . Near syncope 09/09/2009  . Asthma   . Shortness of breath    Past Surgical History:  Past Surgical History  Procedure Date  . Appendectomy   . Insert / replace / remove pacemaker 11/02/09    S/p [employer] PPM - Dr. Johney Frame  . Cholecystectomy   . Abdominal hysterectomy   . Eye surgery   . Total knee arthroplasty 1980's    Left    PT Assessment/Plan/Recommendation PT Assessment Clinical Impression Statement: very pleasant pt who appears to be at functional baseline...has a short leg brace at home that is not worn since it needs to be adjusted ...this should be done by the family in order to improve gait safety PT Recommendation/Assessment: All further PT needs can be met in the next venue of care PT Problem List: Decreased safety awareness Problem List Comments: assess for fit of brace in home and assess home safety PT Recommendation Follow Up Recommendations: Home health PT Equipment Recommended: Defer to next venue PT Goals     PT Evaluation Precautions/Restrictions  Precautions Precautions: Fall Precaution Comments: has a R drop foot from old [place]...has a SLB but doesn't wear it as it doesn't fit  properly...her fall ridk would be mucn lower if her brace was adjusted Required Braces or Orthoses: No (see above) Restrictions Weight Bearing Restrictions: No Prior Functioning  Home Living Lives With: Alone Receives Help From: Family Type of Home: House Home Layout: One level Home Access: Stairs to enter Entrance Stairs-Rails: Right Entrance Stairs-Number of Steps: 3 Home Adaptive Equipment: Environmental consultant - four wheeled;Walker - rolling Prior Function Level of Independence: Independent with basic ADLs;Independent with transfers;Independent with gait;Requires assistive device for independence Driving: No Vocation: Retired Producer, television/film/video: Awake/alert Overall Cognitive Status: History of cognitive impairments History of Cognitive Impairment: Appears at baseline functioning Orientation Level: Oriented X4 Sensation/Coordination Sensation Light Touch: Appears Intact Stereognosis: Not tested Hot/Cold: Not tested Proprioception: Appears Intact Coordination Gross Motor Movements are Fluid and Coordinated: Yes Fine Motor Movements are Fluid and Coordinated: No Extremity Assessment RUE Assessment RUE Assessment: Within Functional Limits LUE Assessment LUE Assessment: Within Functional Limits RLE Assessment RLE Assessment: Exceptions to [facility] RLE Strength Right Hip Flexion: 3-/5 Right Hip Extension: 3-/5 Right Hip ABduction: 3-/5 Right Knee Extension: 4/5 Right Ankle Dorsiflexion: 0/5 Right Ankle Plantar Flexion: 1/5 LLE Assessment LLE Assessment: Within Functional Limits Mobility (including Balance) Bed Mobility Bed Mobility: Yes Supine to Sit: 7: Independent Sit to Supine - Right: 7: Independent Transfers Transfers: Yes Sit to Stand: 7: Independent Stand to Sit: 7: Independent Stand Pivot Transfers: 7: Independent Ambulation/Gait Ambulation/Gait: Yes Ambulation/Gait Assistance: 6:  Modified independent (Device/Increase time) Ambulation Distance  (Feet): 150 Feet Assistive device: Rolling walker Gait Pattern: Right steppage Gait velocity: WNL Stairs: No Wheelchair Mobility Wheelchair Mobility: No  Posture/Postural Control Posture/Postural Control: No significant limitations Balance Balance Assessed: No Exercise    End of Session PT - End of Session Equipment Utilized During Treatment: Gait belt Activity Tolerance: Patient tolerated treatment well Patient left: in bed;with call bell in reach;with bed alarm set Nurse Communication: Mobility status for transfers;Mobility status for ambulation General Behavior During Session: [facility] for tasks performed Cognition: [facility] for tasks performed  Konrad Penta 09/11/2011, 11:05 AM

## 2011-09-11 NOTE — Progress Notes (Signed)
INITIAL ADULT NUTRITION ASSESSMENT Date: 09/11/2011   Time: 2:33 PM Reason for Assessment:  Poor po intake/ unplanned wt loss ~50# x 6 mo.  ASSESSMENT: Female 77 y.o.  Dx: UTI (lower urinary tract infection)   Past Medical History  Diagnosis Date  . COPD (chronic obstructive pulmonary disease)   . Syncope   . Essential hypertension, benign   . Hyperlipidemia   . Benign positional vertigo   . History of shingles   . Type 2 diabetes mellitus   . Osteoporosis   . Second degree Mobitz II AV block     s/p PPM 11/02/09  . Myocardial infarction   . Near syncope 09/09/2009  . Asthma   . Shortness of breath    Scheduled Meds:   . amLODipine  5 mg Oral Daily  . aspirin EC  325 mg Oral Daily  . cefTRIAXone (ROCEPHIN)  IV  1 g Intravenous Q24H  . cloNIDine  0.1 mg Oral BID  . enoxaparin  40 mg Subcutaneous Q24H  . Fluticasone-Salmeterol  1 puff Inhalation Q12H  . insulin aspart  0-15 Units Subcutaneous TID WC  . insulin detemir  10 Units Subcutaneous Daily  . olmesartan  40 mg Oral Daily  . pantoprazole  40 mg Oral Daily  . sertraline  25 mg Oral Daily  . sodium chloride  3 mL Intravenous Q12H   Continuous Infusions:  PRN Meds:.sodium chloride, diphenhydrAMINE, HYDROcodone-homatropine, sodium chloride  Ht: 5\' 6"  (167.6 cm)  Wt: 179 lb 10.8 oz (81.5 kg)  Ideal Wt: 59.3 kg (130#) % Ideal Wt: 138%   Usual Wt: 220-230# % Usual Wt: 82%  Body mass index is 29.00 kg/(m^2).  Food/Nutrition Related Hx: Pt reports 5 hospitalizations this year, a 50# unintentional wt loss x 6 months. Daughter-in-law present who says DM poorly managed i.e.pt doesn't check blood glucose, home diet is Regular.Pt skips meals and appetite poor prior to admission;especially the past 7-d. Denies chew or swallow difficulty despite missing teeth.   CMP     Component Value Date/Time   NA 138 09/09/2011 0128   K 4.8 09/09/2011 0128   CL 99 09/09/2011 0128   CO2 27 09/09/2011 0128   GLUCOSE 374* 09/09/2011  0128   BUN 32* 09/09/2011 0128   CREATININE 1.04 09/09/2011 0128   CALCIUM 11.0* 09/09/2011 0128   PROT 6.1 10/30/2009 0434   ALBUMIN 3.4* 10/30/2009 0434   AST 16 10/30/2009 0434   ALT 17 10/30/2009 0434   ALKPHOS 48 10/30/2009 0434   BILITOT 0.5 10/30/2009 0434   GFRNONAA 49* 09/09/2011 0128   GFRAA 56* 09/09/2011 0128   CBC    Component Value Date/Time   WBC 11.7* 09/09/2011 0128   RBC 4.86 09/09/2011 0128   HGB 13.7 09/09/2011 0128   HCT 43.6 09/09/2011 0128   PLT 214 09/09/2011 0128   MCV 89.7 09/09/2011 0128   MCH 28.2 09/09/2011 0128   MCHC 31.4 09/09/2011 0128   RDW 13.0 09/09/2011 0128   LYMPHSABS 1.7 08/07/2011 2035   MONOABS 0.8 08/07/2011 2035   EOSABS 0.3 08/07/2011 2035   BASOSABS 0.1 08/07/2011 2035   CBG (last 3)   Basename 09/11/11 1101 09/11/11 0739 09/10/11 2125  GLUCAP 272* 251* 191*    Intake/Output Summary (Last 24 hours) at 09/11/11 1437 Last data filed at 09/11/11 1300  Gross per 24 hour  Intake    440 ml  Output      0 ml  Net    440 ml  Diet Order: Carb Control Med (0-50%)  Supplements/Tube Feeding:None  IVF:    Estimated Nutritional Needs:   Kcal:1475-1770 Protein:77-87 grams Fluid:1.8 L/d  NUTRITION DIAGNOSIS: -Inadequate oral intake (NI-2.1).  Status: Ongoing  RELATED TO:  -Nausea and vomiting, hyperglycemia,UTI  AS EVIDENCE BY:  -Pt nutrition hx which includes poor oral intake and unplanned wt loss of ~50#  GOALS: -Pt will tol CHO Mod Med diet without N/V and consume at least 75% of est energy and protein needs.  EDUCATION NEEDS: -Education needs addressed r/t improving blood glucose control through following a CHO Mod diet.  INTERVENTION: -Add milk with all meals - Glucerna BID between meals  Dietitian [phone]  DOCUMENTATION CODES Per approved criteria  -Severe  malnutrition in the context of social or environmental circumstances    Francene Boyers 09/11/2011, 2:33 PM

## 2011-09-11 NOTE — Plan of Care (Signed)
Problem: Phase II Progression Outcomes Goal: O2 sats > equal to 90% on RA or at baseline Outcome: Completed/Met Date Met:  09/11/11 93% RA oxygen saturation with ambulation

## 2011-09-12 LAB — URINE CULTURE: Culture: NO GROWTH

## 2011-09-12 LAB — GLUCOSE, CAPILLARY: Glucose-Capillary: 197 mg/dL — ABNORMAL HIGH (ref 70–99)

## 2011-09-12 MED ORDER — INSULIN DETEMIR 100 UNIT/ML ~~LOC~~ SOLN
15.0000 [IU] | Freq: Every day | SUBCUTANEOUS | Status: DC
  Administered 2011-09-12 – 2011-09-14 (×3): 15 [IU] via SUBCUTANEOUS
  Filled 2011-09-12: qty 3

## 2011-09-12 MED ORDER — TUBERCULIN PPD 5 UNIT/0.1ML ID SOLN
5.0000 [IU] | Freq: Once | INTRADERMAL | Status: AC
  Administered 2011-09-12: 5 [IU] via INTRADERMAL
  Filled 2011-09-12: qty 0.1

## 2011-09-12 NOTE — Progress Notes (Signed)
PPD skin test placed in left anterior forearm.  To be read in 48 hours.  Will pass on in RN report.  Schonewitz, Candelaria Stagers 09/12/2011

## 2011-09-12 NOTE — Progress Notes (Signed)
NAMESHEROL, SABAS               ACCOUNT NO.:  [id number]  MEDICAL RECORD NO.:  [id number]  LOCATION:  A335                          FACILITY:  [facility]  PHYSICIAN:  Sharronda Schweers G. Renard Matter, MD   DATE OF BIRTH:  Nov 29, 1926  DATE OF PROCEDURE: DATE OF DISCHARGE:                                PROGRESS NOTE   SUBJECTIVE:  The patient remains alert.  She has had no further vomiting.  Blood sugars are still high, but in a better range.  They are ranged from 174 to 251.  OBJECTIVE:  VITAL SIGNS:  Blood pressure 143/78, respirations 22, pulse 69, and temp 98.1. LUNGS:  Clear to P and A. HEART:  Regular rhythm. ABDOMEN:  No palpable organs or masses. SKIN:  Warm and dry. NEUROLOGIC:  Grossly normal.  ASSESSMENT:  The patient was admitted with episodes of vomiting, poor control blood sugar.  Underlying mild dementia.  PLAN:  To continue current regimen.  I have added Norvasc since pressures have been elevated.  Social service is working with family with reference to possible placement in an assisted living, nursing facility at least on a temporary basis.     Jatavis Malek G. Renard Matter, MD     AGM/MEDQ  D:  09/12/2011  T:  09/12/2011  Job:  [id number]

## 2011-09-12 NOTE — Clinical Documentation Improvement (Signed)
MALNUTRITION DOCUMENTATION CLARIFICATION  THIS DOCUMENT IS NOT A PERMANENT PART OF THE MEDICAL RECORD  TO RESPOND TO THE THIS QUERY, FOLLOW THE INSTRUCTIONS BELOW:  1. If needed, update documentation for the patient's encounter via the notes activity.  2. Access this query again and click edit on the [employer].  3. After updating, or not, click F2 to complete all highlighted (required) fields concerning your review. Select "additional documentation in the medical record" OR "no additional documentation provided".  4. Click Sign note button.  5. The deficiency will fall out of your InBasket *Please let us know if you are not able to compete this workflow by phone or e-mail (listed below).  Please update your documentation within the medical record to reflect your response to this query.                                                                                        09/12/11   Dear Dr.McInnis / Associates,  In a better effort to capture your patient's severity of illness, reflect appropriate length of stay and utilization of resources, a review of the patient medical record has revealed the following indicators.    Based on your clinical judgment, please clarify and document in a progress note and/or discharge summary the clinical condition associated with the following supporting information:  In responding to this query please exercise your independent judgment.  The fact that a query is asked, does not imply that any particular answer is desired or expected.  Possible Clinical Conditions?  _______Mild Malnutrition  _______Moderate Malnutrition _______Severe Malnutrition   _______Protein Calorie Malnutrition _______Severe Protein Calorie Malnutrition _______Other Condition________________ _______Cannot clinically determine     Clinical Information:   Risk Factors: Underlying dementia confused Inadequate oral intake Skips meals  Poor appetite  Signs &  Symptoms: -Ht: 5'6" Wt: 179 lbs -BMI: 29 -Weight Loss 50 lbs over 6 months  -Diagnostics:  -Albumin level: 3.4 -Calcium level: 11  Treatments: -Nutrition Consult: Recommends: -Add milk with all meals - Glucerna BID between meals   You may use possible, probable, or suspect with inpatient documentation. possible, probable, suspected diagnoses MUST be documented at the time of discharge  Reviewed: No response from MD Thank You,  Harless Litten RN, MSN Clinical Documentation Specialist: Office# [phone] [facility] Information Management [place]

## 2011-09-13 LAB — GLUCOSE, CAPILLARY
Glucose-Capillary: 249 mg/dL — ABNORMAL HIGH (ref 70–99)
Glucose-Capillary: 277 mg/dL — ABNORMAL HIGH (ref 70–99)

## 2011-09-13 NOTE — Progress Notes (Signed)
Inpatient Diabetes Program Recommendations  AACE/ADA: New Consensus Statement on Inpatient Glycemic Control (2009)  Target Ranges:  Prepandial:   less than 140 mg/dL      Peak postprandial:   less than 180 mg/dL (1-2 hours)      Critically ill patients:  140 - 180 mg/dL   Reason for Visit: Elevated fasting glucose: 249 mg/dL  Inpatient Diabetes Program Recommendations Insulin - Basal: Increase Levemir to 22 units daily HgbA1C: Check HgbA1C: To assess glycemic control

## 2011-09-13 NOTE — Progress Notes (Signed)
Kirsten, Greene               ACCOUNT NO.:  [id number]  MEDICAL RECORD NO.:  [id number]  LOCATION:  A335                          FACILITY:  [facility]  PHYSICIAN:  Carmelo Reidel G. Renard Matter, MD   DATE OF BIRTH:  May 06, 1927  DATE OF PROCEDURE: DATE OF DISCHARGE:                                PROGRESS NOTE   This patient remains alert.  Does have a productive cough.  Blood sugars run from 197 to 325.  OBJECTIVE:  VITAL SIGNS:  Blood pressure 131/67, respirations 18, pulse 59, temp 98.3. LUNGS:  Clear to P and A. HEART:  Regular rhythm. ABDOMEN:  No palpable organs or masses. SKIN:  Warm and dry. NEUROLOGICAL:  Grossly normal.  ASSESSMENT:  The patient was admitted with episodes of vomiting and poor control of her blood sugar, has underlying mild dementia.  PLAN:  To continue to monitor vital signs.  Social service is working with family with reference to possible placement in assisted living facility at least on a temporary basis.     Endiya Klahr G. Renard Matter, MD     AGM/MEDQ  D:  09/13/2011  T:  09/13/2011  Job:  [id number]

## 2011-09-13 NOTE — Progress Notes (Addendum)
CSW spoke with pt this morning regarding ALF and she is now agreeable.  [facility] has accepted pt and CSW discussed with daughter-in-law.  Family plan to discuss today.  Pt will also be evaluated by Leane Platt today as she would prefer to be closer to family.  Per MD, pt should be d/c tomorrow following negative PPD.  Karn Cassis

## 2011-09-14 LAB — GLUCOSE, CAPILLARY: Glucose-Capillary: 296 mg/dL — ABNORMAL HIGH (ref 70–99)

## 2011-09-14 MED ORDER — AMLODIPINE BESYLATE 5 MG PO TABS: 5.0000 mg | ORAL_TABLET | Freq: Every day | ORAL | Status: DC

## 2011-09-14 MED ORDER — VALSARTAN 320 MG PO TABS: 320.0000 mg | ORAL_TABLET | Freq: Every day | ORAL | Status: DC

## 2011-09-14 MED ORDER — INSULIN DETEMIR 100 UNIT/ML ~~LOC~~ SOLN: 20.0000 [IU] | Freq: Every day | SUBCUTANEOUS | Status: DC

## 2011-09-14 NOTE — Progress Notes (Signed)
TB test to left anterior forearm read. Negative, no redness, no induration.

## 2011-09-14 NOTE — Progress Notes (Signed)
Pt d/c today by MD to ALF.  CSW spoke with family and they request [facility] as there were issues yesterday in attempting to transfer pt to [facility].  MD to call in scripts which is okay per Tammi at facility.  Pt to transfer via facility van.  Quita Skye, RN, reviewed [facility] for accuracy.  Karn Cassis

## 2011-09-14 NOTE — Discharge Summary (Signed)
NAMEGARNET, CHATMON               ACCOUNT NO.:  [id number]  MEDICAL RECORD NO.:  [id number]  LOCATION:  A335                          FACILITY:  [facility]  PHYSICIAN:  Mubarak Bevens G. Renard Matter, MD   DATE OF BIRTH:  Jul 22, 1927  DATE OF ADMISSION:  09/09/2011 DATE OF DISCHARGE:  12/13/2012LH                              DISCHARGE SUMMARY   DIAGNOSES: 1. Insulin-dependent diabetes 2. Uncontrolled hypertension. 3. Mild dementia. 4. Possible diabetic gastroparesis. 5. History of chronic obstructive pulmonary disease. 6. Hyperlipidemia. 7. Previous myocardial infarction.  CONDITION:  Stable and improved at the time of her discharge.  HISTORY:  The patient was brought to the emergency room after worsening, confusion as noted by family members.  She had gone to the point where she could no longer live by herself and take her medications safely. She was getting confused with reference to which medicines to take that time.  She had not taken her insulin over a period of several days.  She has vomiting prior to admission, had previous episodes of hematuria.  PHYSICAL EXAMINATION ON ADMISSION:  VITAL SIGNS:  Blood pressure 169/57, pulse 85, temp 98, respirations 20. HEENT:  Eyes:  PERRLA.  TMs negative.  Oropharynx benign. NECK:  Supple.  No JVD or thyroid abnormalities. HEART:  Regular rhythm.  No murmurs. LUNGS:  Clear to P and A. ABDOMEN:  No palpable organs or masses. NEUROLOGIC:  Grossly intact.  The patient does have a multiple bites on skin.  LABORATORY DATA:  Urinalysis on admission negative.  Her glucoses on admission ranged from 251 to 174 and it was the latter part of hospitalization, glucoses ranged from 195 to 277.  X-ray:  Chest x-ray, no acute cardiopulmonary disease.  HOSPITAL COURSE:  The patient at the time of admission was placed on the carb-modified diet.  She was started on normal saline KVO rate.  She was started on the following medications: 1. Aspirin 325 mg daily. 2.  Clonidine 0.1 mg b.i.d. 3. Pantoprazole 40 mg daily. 4. Olmesartan 40 mg daily. 5. Sertraline 25 mg daily. 6. She was given Levemir subcutaneously 10 units daily and sliding     scale NovoLog insulin. 7. Also, was started on IV Rocephin 1 g every 24 hours. 8. She was given p.r.n. Hycodan syrup 5 mL every 4 hours p.r.n.  Because of elevated blood pressure, the patient had Norvasc 5 mg daily added to her regimen.  The patient did gradually improve.  Her blood pressure came in to better control.  Her sugars came into better control, but it was obvious that the patient could not go back home to a situation where she was having to control her medications.  Multiple contacts were made with the family and will have help for social services.  We were able to get her into its an assisted living place, namely [facility].  The patient will be discharged on the following medications: 1. Amlodipine 5 mg daily. 2. Aspirin 325 mg daily. 3. Catapres 0.1 mg b.i.d. 4. Advair 250/50 one puff every 12 hours. 5. Levemir insulin 20 units daily. 6. Benicar 40 mg daily. 7. Protonix 40 mg daily. 8. Sertraline 25 mg daily.  The  patient was stable at the time of her discharge.     Sundiata Ferrick G. Renard Matter, MD     AGM/MEDQ  D:  09/14/2011  T:  09/14/2011  Job:  [id number]

## 2011-09-14 NOTE — Progress Notes (Signed)
IV removed, site WNL.  Pt given d/c instructions (FL2) and new prescriptions.  Discussed discharge plan with patient, patient verbalizes understanding. Discharge packet given to transporter from [facility]. Pt is stable at this time. Pt taken to main entrance in wheelchair by Chauncey Cruel.

## 2012-01-01 ENCOUNTER — Emergency Department (HOSPITAL_COMMUNITY): Payer: Medicare Other

## 2012-01-01 ENCOUNTER — Emergency Department (HOSPITAL_COMMUNITY)
Admission: EM | Admit: 2012-01-01 | Discharge: 2012-01-01 | Disposition: A | Discharge status: Home / Self Care | Payer: Medicare Other | Attending: Emergency Medicine | Admitting: Emergency Medicine

## 2012-01-01 ENCOUNTER — Encounter (HOSPITAL_COMMUNITY): Payer: Self-pay | Admitting: *Deleted

## 2012-01-01 DIAGNOSIS — J449 Chronic obstructive pulmonary disease, unspecified: Secondary | ICD-10-CM | POA: Insufficient documentation

## 2012-01-01 DIAGNOSIS — I252 Old myocardial infarction: Secondary | ICD-10-CM | POA: Insufficient documentation

## 2012-01-01 DIAGNOSIS — M47812 Spondylosis without myelopathy or radiculopathy, cervical region: Secondary | ICD-10-CM | POA: Insufficient documentation

## 2012-01-01 DIAGNOSIS — R51 Headache: Secondary | ICD-10-CM | POA: Insufficient documentation

## 2012-01-01 DIAGNOSIS — Z794 Long term (current) use of insulin: Secondary | ICD-10-CM | POA: Insufficient documentation

## 2012-01-01 DIAGNOSIS — I10 Essential (primary) hypertension: Secondary | ICD-10-CM | POA: Insufficient documentation

## 2012-01-01 DIAGNOSIS — J4489 Other specified chronic obstructive pulmonary disease: Secondary | ICD-10-CM | POA: Insufficient documentation

## 2012-01-01 DIAGNOSIS — B86 Scabies: Secondary | ICD-10-CM | POA: Insufficient documentation

## 2012-01-01 DIAGNOSIS — R6889 Other general symptoms and signs: Secondary | ICD-10-CM | POA: Insufficient documentation

## 2012-01-01 DIAGNOSIS — E119 Type 2 diabetes mellitus without complications: Secondary | ICD-10-CM | POA: Insufficient documentation

## 2012-01-01 LAB — BASIC METABOLIC PANEL
CO2: 27 mEq/L (ref 19–32)
Chloride: 96 mEq/L (ref 96–112)
Creatinine, Ser: 0.7 mg/dL (ref 0.50–1.10)
Glucose, Bld: 247 mg/dL — ABNORMAL HIGH (ref 70–99)
Sodium: 134 mEq/L — ABNORMAL LOW (ref 135–145)

## 2012-01-01 LAB — DIFFERENTIAL
Basophils Absolute: 0 10*3/uL (ref 0.0–0.1)
Eosinophils Relative: 0 % (ref 0–5)
Lymphocytes Relative: 9 % — ABNORMAL LOW (ref 12–46)
Lymphs Abs: 1.7 10*3/uL (ref 0.7–4.0)
Monocytes Absolute: 1.3 10*3/uL — ABNORMAL HIGH (ref 0.1–1.0)
Neutro Abs: 14.8 10*3/uL — ABNORMAL HIGH (ref 1.7–7.7)

## 2012-01-01 LAB — CBC
HCT: 42.3 % (ref 36.0–46.0)
MCV: 86.9 fL (ref 78.0–100.0)
RBC: 4.87 MIL/uL (ref 3.87–5.11)
RDW: 13.4 % (ref 11.5–15.5)
WBC: 17.9 10*3/uL — ABNORMAL HIGH (ref 4.0–10.5)

## 2012-01-01 LAB — URINALYSIS, ROUTINE W REFLEX MICROSCOPIC
Bilirubin Urine: NEGATIVE
Hgb urine dipstick: NEGATIVE
Nitrite: NEGATIVE
Protein, ur: NEGATIVE mg/dL
Urobilinogen, UA: 0.2 mg/dL (ref 0.0–1.0)

## 2012-01-01 MED ORDER — HYDROCODONE-ACETAMINOPHEN 5-325 MG PO TABS
1.0000 | ORAL_TABLET | ORAL | Status: DC | PRN
  Administered 2012-01-01: 1 via ORAL
  Filled 2012-01-01: qty 1

## 2012-01-01 MED ORDER — CYCLOBENZAPRINE HCL 10 MG PO TABS
5.0000 mg | ORAL_TABLET | Freq: Once | ORAL | Status: AC
  Administered 2012-01-01: 5 mg via ORAL
  Filled 2012-01-01: qty 1

## 2012-01-01 MED ORDER — PERMETHRIN 5 % EX CREA: TOPICAL_CREAM | CUTANEOUS | Status: AC

## 2012-01-01 MED ORDER — CYCLOBENZAPRINE HCL 10 MG PO TABS: 5.0000 mg | ORAL_TABLET | Freq: Three times a day (TID) | ORAL | Status: AC | PRN

## 2012-01-01 MED ORDER — HYDROCODONE-ACETAMINOPHEN 5-325 MG PO TABS: 1.0000 | ORAL_TABLET | Freq: Four times a day (QID) | ORAL | Status: AC | PRN

## 2012-01-01 NOTE — ED Notes (Signed)
Pt presents with a headache, dizziness, neck pain. Pt also has noted rt sided weakness, to which she has a history of polio as a chlid

## 2012-01-01 NOTE — ED Notes (Signed)
[facility] notified of pt. Needing transportation back to facility.

## 2012-01-01 NOTE — ED Notes (Signed)
Pt is a resident of [facility]

## 2012-01-01 NOTE — ED Provider Notes (Signed)
History     CSN: [id number]  Arrival date & time 01/01/12  1310   First MD Initiated Contact with Patient 01/01/12 1622      Chief Complaint  Patient presents with  . Headache  . Dizziness  . Torticollis    (Consider location/radiation/quality/duration/timing/severity/associated sxs/prior treatment) HPI Comments: Patient is an 78 year old lady who has 2 problems para. One is that her neck is hurting. She has no history of injury. She notes pain in her back and neck that goes into her left arm. Her other problem is that she has a rash. She has itchy spots that have popped up all over her body. She scratches them. She says he's had some type of cream to apply to them but it hasn't seemed to help.  Patient is a 78 y.o. female presenting with general illness and rash.  Illness  Episode onset: She has had pain in the neck and into the left arm.  This is a new problem, going on for about two days. The problem has been unchanged. The problem is moderate. The symptoms are relieved by nothing. Associated symptoms include headaches, neck pain and rash. Associated symptoms comments: Pain in left arm. .  Rash  This is a recurrent problem. The current episode started more than 1 week ago. The problem has been gradually worsening. The problem is associated with nothing (She thinks there are worms under her skin that are making the rash.). The maximum temperature recorded prior to her arrival was 102 to 102.9 F. The rash is present on the trunk, abdomen, left upper leg, left lower leg, right lower leg, right upper leg, right arm and left arm. Pain severity now: The rash is itchy, more so at night. Associated symptoms include itching. Treatments tried: She says she has had a cream applied to the rash, without improvement.    Past Medical History  Diagnosis Date  . COPD (chronic obstructive pulmonary disease)   . Syncope   . Essential hypertension, benign   . Hyperlipidemia   . Benign positional  vertigo   . History of shingles   . Type 2 diabetes mellitus   . Osteoporosis   . Second degree Mobitz II AV block     s/p PPM 11/02/09  . Myocardial infarction   . Near syncope 09/09/2009  . Asthma   . Shortness of breath     Past Surgical History  Procedure Date  . Appendectomy   . Insert / replace / remove pacemaker 11/02/09    S/p [employer] PPM - Dr. Johney Frame  . Cholecystectomy   . Abdominal hysterectomy   . Eye surgery   . Total knee arthroplasty 1980's    Left    Family History  Problem Relation Age of Onset  . Cancer Mother     Stomach  . Cancer Father     Stomach  . Heart disease Neg Hx     Nor sudden cardiac death  . Diabetes Other     Several family members  . Diabetes Sister   . Diabetes Brother   . Diabetes Sister     History  Substance Use Topics  . Smoking status: Former Smoker -- 30 years  . Smokeless tobacco: Not on file  . Alcohol Use: No    OB History    Grav Para Term Preterm Abortions TAB SAB Ect Mult Living                  Review of Systems  Constitutional: Negative.  HENT: Positive for neck pain.   Eyes: Negative.   Respiratory: Negative.   Cardiovascular: Negative.   Gastrointestinal: Negative.   Genitourinary: Negative.   Skin: Positive for itching and rash.  Neurological: Positive for headaches.  Psychiatric/Behavioral: Negative.     Allergies  Review of patient's allergies indicates no known allergies.  Home Medications   Current Outpatient Rx  Name Route Sig Dispense Refill  . AMLODIPINE BESYLATE 5 MG PO TABS Oral Take 1 tablet (5 mg total) by mouth daily. 30 tablet 5  . ASPIRIN EC 325 MG PO TBEC Oral Take 325 mg by mouth as needed. For pain    . ATORVASTATIN CALCIUM 10 MG PO TABS Oral Take 10 mg by mouth daily.      Marland Kitchen CLONIDINE HCL 0.1 MG PO TABS Oral Take 1 tablet (0.1 mg total) by mouth 2 (two) times daily. 60 tablet   . FLUTICASONE-SALMETEROL 250-50 MCG/DOSE IN AEPB Inhalation Inhale 1 puff into the lungs every 12  (twelve) hours.      . INSULIN DETEMIR 100 UNIT/ML [place] SOLN Subcutaneous Inject 20 Units into the skin daily. 10 mL   . PANTOPRAZOLE SODIUM 40 MG PO TBEC Oral Take 40 mg by mouth daily.      . SERTRALINE HCL 25 MG PO TABS Oral Take 25 mg by mouth daily.      Marland Kitchen VALSARTAN 320 MG PO TABS Oral Take 1 tablet (320 mg total) by mouth daily.        BP 194/71  Pulse 76  Temp(Src) 97.6 F (36.4 C) (Oral)  Resp 22  Ht 5\' 4"  (1.626 m)  Wt 180 lb (81.647 kg)  BMI 30.90 kg/m2  SpO2 95%  Physical Exam  Nursing note and vitals reviewed. Constitutional: She is oriented to person, place, and time. She appears well-developed and well-nourished. No distress.  HENT:  Head: Normocephalic and atraumatic.  Right Ear: External ear normal.  Left Ear: External ear normal.  Mouth/Throat: Oropharynx is clear and moist.  Eyes: Conjunctivae and EOM are normal. Pupils are equal, round, and reactive to light.  Neck: Normal range of motion. Neck supple.  Cardiovascular: Normal rate, regular rhythm and normal heart sounds.   Pulmonary/Chest: Effort normal and breath sounds normal.  Abdominal: Soft. Bowel sounds are normal.  Musculoskeletal:       She complains of pain in the neck that goes into the left arm.    Neurological: She is alert and oriented to person, place, and time.       Sensory and motor intact.  Skin: Skin is warm and dry.  Psychiatric: She has a normal mood and affect. Her behavior is normal.    ED Course  Procedures (including critical care time)   7:49 PM Results for orders placed during the hospital encounter of 01/01/12  CBC      Component Value Range   WBC 17.9 (*) 4.0 - 10.5 (K/uL)   RBC 4.87  3.87 - 5.11 (MIL/uL)   Hemoglobin 13.5  12.0 - 15.0 (g/dL)   HCT 45.4  09.8 - 11.9 (%)   MCV 86.9  78.0 - 100.0 (fL)   MCH 27.7  26.0 - 34.0 (pg)   MCHC 31.9  30.0 - 36.0 (g/dL)   RDW 14.7  82.9 - 56.2 (%)   Platelets 287  150 - 400 (K/uL)  DIFFERENTIAL      Component Value Range    Neutrophils Relative 83 (*) 43 - 77 (%)   Neutro Abs 14.8 (*) 1.7 -  7.7 (K/uL)   Lymphocytes Relative 9 (*) 12 - 46 (%)   Lymphs Abs 1.7  0.7 - 4.0 (K/uL)   Monocytes Relative 7  3 - 12 (%)   Monocytes Absolute 1.3 (*) 0.1 - 1.0 (K/uL)   Eosinophils Relative 0  0 - 5 (%)   Eosinophils Absolute 0.1  0.0 - 0.7 (K/uL)   Basophils Relative 0  0 - 1 (%)   Basophils Absolute 0.0  0.0 - 0.1 (K/uL)  BASIC METABOLIC PANEL      Component Value Range   Sodium 134 (*) 135 - 145 (mEq/L)   Potassium 4.5  3.5 - 5.1 (mEq/L)   Chloride 96  96 - 112 (mEq/L)   CO2 27  19 - 32 (mEq/L)   Glucose, Bld 247 (*) 70 - 99 (mg/dL)   BUN 22  6 - 23 (mg/dL)   Creatinine, Ser 4.09  0.50 - 1.10 (mg/dL)   Calcium 81.1  8.4 - 10.5 (mg/dL)   GFR calc non Af Amer 79 (*) >90 (mL/min)   GFR calc Af Amer >90  >90 (mL/min)  URINALYSIS, ROUTINE W REFLEX MICROSCOPIC      Component Value Range   Color, Urine YELLOW  YELLOW    APPearance CLEAR  CLEAR    Specific Gravity, Urine 1.025  1.005 - 1.030    pH 5.5  5.0 - 8.0    Glucose, UA 100 (*) NEGATIVE (mg/dL)   Hgb urine dipstick NEGATIVE  NEGATIVE    Bilirubin Urine NEGATIVE  NEGATIVE    Ketones, ur NEGATIVE  NEGATIVE (mg/dL)   Protein, ur NEGATIVE  NEGATIVE (mg/dL)   Urobilinogen, UA 0.2  0.0 - 1.0 (mg/dL)   Nitrite NEGATIVE  NEGATIVE    Leukocytes, UA NEGATIVE  NEGATIVE    Dg Cervical Spine Complete  01/01/2012  *RADIOLOGY REPORT*  Clinical Data: Neck pain, no recent injury, fell several weeks ago  CERVICAL SPINE - COMPLETE 4+ VIEW  Comparison: None.  Findings: The cervical vertebrae are in normal alignment.  There is degenerative disc disease at C3-4, C5-6, and C6-7 levels with loss of disc space and spurring.  No acute fracture is seen.  No prevertebral soft tissue swelling is noted.  No significant foraminal narrowing is seen although there is mild narrowing at C3- 4 and C5-6 levels.  The odontoid process is intact.  The lung apices are clear.  IMPRESSION: Degenerative  disc disease at C3-4, C5-6 and C6-7 with some foraminal narrowing at C 3/4 and C5-6 levels.  Original Report Authenticated By: Juline Patch, M.D.    Pt's cervical spine films show that she has cervical osteoarthritis.  Rx with Vicodin, Flexeril.  Her rash looks like scabies to me; Rx with elimite as previously prescribed.   1. Cervical osteoarthritis   2. Scabies              Carleene Cooper III, MD 01/01/12 [phone]

## 2012-01-01 NOTE — Discharge Instructions (Signed)
[name] had physical examination, laboratory tests, and cervical spine x-rays to check on her for neck pain and a rash on her body.  Her tests and x-rays showed osteoarthritis of the cervical spine.  She can take the pain medicine hydrocodone-acetaminophen every 6 hours if needed for pain, and the muscle relaxant medicine Flexeril 5 mg three times per day to relieve muscle spasm.  She should have the cream Elimite applied from the neck to the toes and left on for 12 hours; her rash appears to be caused by scabies.

## 2012-01-02 LAB — URINE CULTURE
Colony Count: 10000
Special Requests: NORMAL

## 2012-05-05 ENCOUNTER — Encounter (HOSPITAL_COMMUNITY): Payer: Self-pay | Admitting: Emergency Medicine

## 2012-05-05 ENCOUNTER — Emergency Department (HOSPITAL_COMMUNITY): Payer: Medicare Other

## 2012-05-05 ENCOUNTER — Emergency Department (HOSPITAL_COMMUNITY)
Admission: EM | Admit: 2012-05-05 | Discharge: 2012-05-05 | Disposition: A | Discharge status: Home / Self Care | Payer: Medicare Other | Attending: Emergency Medicine | Admitting: Emergency Medicine

## 2012-05-05 DIAGNOSIS — I252 Old myocardial infarction: Secondary | ICD-10-CM | POA: Insufficient documentation

## 2012-05-05 DIAGNOSIS — Z794 Long term (current) use of insulin: Secondary | ICD-10-CM | POA: Insufficient documentation

## 2012-05-05 DIAGNOSIS — R404 Transient alteration of awareness: Secondary | ICD-10-CM | POA: Insufficient documentation

## 2012-05-05 DIAGNOSIS — L299 Pruritus, unspecified: Secondary | ICD-10-CM | POA: Insufficient documentation

## 2012-05-05 DIAGNOSIS — F411 Generalized anxiety disorder: Secondary | ICD-10-CM | POA: Insufficient documentation

## 2012-05-05 DIAGNOSIS — I10 Essential (primary) hypertension: Secondary | ICD-10-CM | POA: Insufficient documentation

## 2012-05-05 DIAGNOSIS — F0390 Unspecified dementia without behavioral disturbance: Secondary | ICD-10-CM | POA: Insufficient documentation

## 2012-05-05 DIAGNOSIS — R5381 Other malaise: Secondary | ICD-10-CM | POA: Insufficient documentation

## 2012-05-05 DIAGNOSIS — E119 Type 2 diabetes mellitus without complications: Secondary | ICD-10-CM | POA: Insufficient documentation

## 2012-05-05 HISTORY — DX: Unspecified dementia, unspecified severity, without behavioral disturbance, psychotic disturbance, mood disturbance, and anxiety: F03.90

## 2012-05-05 HISTORY — DX: Pruritus, unspecified: L29.9

## 2012-05-05 HISTORY — DX: Delusional disorders: F22

## 2012-05-05 LAB — BASIC METABOLIC PANEL
CO2: 31 mEq/L (ref 19–32)
Calcium: 10.3 mg/dL (ref 8.4–10.5)
Creatinine, Ser: 0.88 mg/dL (ref 0.50–1.10)
GFR calc Af Amer: 69 mL/min — ABNORMAL LOW (ref >90)
GFR calc non Af Amer: 59 mL/min — ABNORMAL LOW (ref >90)
Sodium: 137 mEq/L (ref 135–145)

## 2012-05-05 LAB — CBC WITH DIFFERENTIAL/PLATELET
Basophils Absolute: 0 10*3/uL (ref 0.0–0.1)
Basophils Relative: 0 % (ref 0–1)
Eosinophils Relative: 1 % (ref 0–5)
Lymphocytes Relative: 14 % (ref 12–46)
MCHC: 31 g/dL (ref 30.0–36.0)
MCV: 85.7 fL (ref 78.0–100.0)
Monocytes Absolute: 1 10*3/uL (ref 0.1–1.0)
Platelets: 268 10*3/uL (ref 150–400)
RDW: 14 % (ref 11.5–15.5)
WBC: 13.7 10*3/uL — ABNORMAL HIGH (ref 4.0–10.5)

## 2012-05-05 LAB — URINALYSIS, ROUTINE W REFLEX MICROSCOPIC
Glucose, UA: 500 mg/dL — AB
Ketones, ur: NEGATIVE mg/dL
Leukocytes, UA: NEGATIVE
Nitrite: NEGATIVE
Protein, ur: NEGATIVE mg/dL
Urobilinogen, UA: 0.2 mg/dL (ref 0.0–1.0)

## 2012-05-05 LAB — TROPONIN I: Troponin I: <0.3 ng/mL (ref <0.30)

## 2012-05-05 NOTE — ED Provider Notes (Signed)
History     CSN: [id number]  Arrival date & time 05/05/12  1355   First MD Initiated Contact with Patient 05/05/12 1422      Chief Complaint  Patient presents with  . Hyperglycemia  . Fatigue  . Anxiety     The history is provided by the nursing home and the patient. History Limited By: Hx dementia.  Pt was seen at 1500.  Per pt and NH report, pt stats she feels "tired" and "nervous" for the past several days as well as chronic itching.  NH concerned pt's CBG elevated to 383 today. Pt otherwise denies CP/SOB, no cough, no abd pain, no N/V/D, no back pain.     Past Medical History  Diagnosis Date  . COPD (chronic obstructive pulmonary disease)   . Syncope   . Essential hypertension, benign   . Hyperlipidemia   . Benign positional vertigo   . History of shingles   . Type 2 diabetes mellitus   . Osteoporosis   . Second degree Mobitz II AV block     s/p PPM 11/02/09  . Myocardial infarction   . Near syncope 09/09/2009  . Asthma   . Shortness of breath   . Dementia   . Paranoia   . Chronic pruritus     Past Surgical History  Procedure Date  . Appendectomy   . Insert / replace / remove pacemaker 11/02/09    S/p [employer] PPM - Dr. Johney Frame  . Cholecystectomy   . Abdominal hysterectomy   . Eye surgery   . Total knee arthroplasty 1980's    Left    Family History  Problem Relation Age of Onset  . Cancer Mother     Stomach  . Cancer Father     Stomach  . Heart disease Neg Hx     Nor sudden cardiac death  . Diabetes Other     Several family members  . Diabetes Sister   . Diabetes Brother   . Diabetes Sister     History  Substance Use Topics  . Smoking status: Former Smoker -- 30 years  . Smokeless tobacco: Not on file  . Alcohol Use: No     Review of Systems  Unable to perform ROS: Dementia    Allergies  Review of patient's allergies indicates no known allergies.  Home Medications   Current Outpatient Rx  Name Route Sig Dispense Refill  .  AMLODIPINE BESYLATE 5 MG PO TABS Oral Take 1 tablet (5 mg total) by mouth daily. 30 tablet 5  . ASPIRIN EC 325 MG PO TBEC Oral Take 325 mg by mouth daily. For pain    . CLOBETASOL PROPIONATE 0.05 % EX CREA Topical Apply 1 application topically 2 (two) times daily.    Marland Kitchen CLONIDINE HCL 0.1 MG PO TABS Oral Take 1 tablet (0.1 mg total) by mouth 2 (two) times daily. 60 tablet   . FLUTICASONE PROPIONATE 0.05 % EX CREA Topical Apply 1 application topically as needed. For itching    . FLUTICASONE-SALMETEROL 250-50 MCG/DOSE IN AEPB Inhalation Inhale 1 puff into the lungs every 12 (twelve) hours.      Marland Kitchen HYDROXYZINE HCL 50 MG PO TABS Oral Take 50 mg by mouth every 4 (four) hours as needed. For itching    . INSULIN ASPART 100 UNIT/ML [place] SOLN Subcutaneous Inject 2-8 Units into the skin as needed. Sliding scale. Scale as follows 200-249 = 2, 250-299 = 4, 300-349 = 6, 350 & up = 8    .  INSULIN DETEMIR 100 UNIT/ML [place] SOLN Subcutaneous Inject 50 Units into the skin at bedtime.    Marland Kitchen METFORMIN HCL 500 MG PO TABS Oral Take 500 mg by mouth 2 (two) times daily.    Marland Kitchen OLMESARTAN MEDOXOMIL 40 MG PO TABS Oral Take 40 mg by mouth daily.    Marland Kitchen PANTOPRAZOLE SODIUM 40 MG PO TBEC Oral Take 40 mg by mouth daily.      Marland Kitchen POLYVINYL ALCOHOL-POVIDONE 1.4-0.6 % OP SOLN Both Eyes Place 1 drop into both eyes 4 (four) times daily as needed. For dry eyes    . SERTRALINE HCL 25 MG PO TABS Oral Take 25 mg by mouth daily.      Marda Stalker DAILY MOISTURIZER EX LOTN Apply externally Apply 1 application topically 4 (four) times daily as needed. For itching      BP 133/62  Pulse 60  Temp 97.5 F (36.4 C)  Resp 19  Ht 5\' 4"  (1.626 m)  Wt 219 lb (99.338 kg)  BMI 37.59 kg/m2  SpO2 95%  Physical Exam 1505: Physical examination:  Nursing notes reviewed; Vital signs and O2 SAT reviewed;  Constitutional: Well developed, Well nourished, Well hydrated, In no acute distress; Head:  Normocephalic, atraumatic; Eyes: EOMI, PERRL, No scleral icterus;  ENMT: Mouth and pharynx normal, Mucous membranes moist; Neck: Supple, Full range of motion, No lymphadenopathy; Cardiovascular: Regular rate and rhythm, No gallop; Respiratory: Breath sounds clear & equal bilaterally, No wheezes.  Speaking full sentences with ease, Normal respiratory effort/excursion; Chest: Nontender, Movement normal; Abdomen: Soft, Nontender, Nondistended, Normal bowel sounds; Genitourinary: No CVA tenderness; Extremities: Pulses normal, No tenderness, No edema, No calf edema or asymmetry.; Neuro: Awake, alert, confused re: events. Poor historian. Major CN grossly intact.  Speech clear.  No facial droop. No gross focal motor or sensory deficits in extremities.; Skin: Color normal, Warm, Dry.  No rash.  Pt is scratching at herself during exam.   ED Course  Procedures   MDM  MDM Reviewed: previous chart, vitals and nursing note Reviewed previous: ECG Interpretation: ECG, labs, x-ray and CT scan    Date: 05/05/2012  Rate: 64  Rhythm: Electronic pacer  QRS Axis: left  Intervals: normal  ST/T Wave abnormalities: normal  Conduction Disutrbances:nonspecific intraventricular conduction delay  Narrative Interpretation:   Old EKG Reviewed: unchanged; no significant changes from previous EKG dated 08/08/2011.  Results for orders placed during the hospital encounter of 05/05/12  TROPONIN I      Component Value Range   Troponin I <0.30  <0.30 ng/mL  CBC WITH DIFFERENTIAL      Component Value Range   WBC 13.7 (*) 4.0 - 10.5 K/uL   RBC 4.75  3.87 - 5.11 MIL/uL   Hemoglobin 12.6  12.0 - 15.0 g/dL   HCT 40.9  81.1 - 91.4 %   MCV 85.7  78.0 - 100.0 fL   MCH 26.5  26.0 - 34.0 pg   MCHC 31.0  30.0 - 36.0 g/dL   RDW 78.2  95.6 - 21.3 %   Platelets 268  150 - 400 K/uL   Neutrophils Relative 78 (*) 43 - 77 %   Neutro Abs 10.6 (*) 1.7 - 7.7 K/uL   Lymphocytes Relative 14  12 - 46 %   Lymphs Abs 1.9  0.7 - 4.0 K/uL   Monocytes Relative 7  3 - 12 %   Monocytes Absolute 1.0  0.1 -  1.0 K/uL   Eosinophils Relative 1  0 - 5 %  Eosinophils Absolute 0.2  0.0 - 0.7 K/uL   Basophils Relative 0  0 - 1 %   Basophils Absolute 0.0  0.0 - 0.1 K/uL  BASIC METABOLIC PANEL      Component Value Range   Sodium 137  135 - 145 mEq/L   Potassium 4.1  3.5 - 5.1 mEq/L   Chloride 99  96 - 112 mEq/L   CO2 31  19 - 32 mEq/L   Glucose, Bld 177 (*) 70 - 99 mg/dL   BUN 28 (*) 6 - 23 mg/dL   Creatinine, Ser 1.61  0.50 - 1.10 mg/dL   Calcium 09.6  8.4 - 04.5 mg/dL   GFR calc non Af Amer 59 (*) >90 mL/min   GFR calc Af Amer 69 (*) >90 mL/min  URINALYSIS, ROUTINE W REFLEX MICROSCOPIC      Component Value Range   Color, Urine YELLOW  YELLOW   APPearance CLEAR  CLEAR   Specific Gravity, Urine >1.030 (*) 1.005 - 1.030   pH 6.0  5.0 - 8.0   Glucose, UA 500 (*) NEGATIVE mg/dL   Hgb urine dipstick NEGATIVE  NEGATIVE   Bilirubin Urine NEGATIVE  NEGATIVE   Ketones, ur NEGATIVE  NEGATIVE mg/dL   Protein, ur NEGATIVE  NEGATIVE mg/dL   Urobilinogen, UA 0.2  0.0 - 1.0 mg/dL   Nitrite NEGATIVE  NEGATIVE   Leukocytes, UA NEGATIVE  NEGATIVE   Dg Chest 2 View 05/05/2012  *RADIOLOGY REPORT*  Clinical Data: Fatigue and rule out infiltrate  CHEST - 2 VIEW  Comparison: 09/09/2011  Findings: There is a left chest wall pacer device with leads in the right atrial appendage and right ventricle.  Mild cardiac enlargement.  No pleural effusion or edema.  The lungs appear hyperinflated and there are coarsened interstitial markings noted bilaterally.  No airspace consolidation.  IMPRESSION:  1.  Mild cardiac enlargement. 2.  Chronic interstitial coarsening without evidence for pneumonia.  Original Report Authenticated By: Rosealee Albee, M.D.   Ct Head Wo Contrast 05/05/2012  *RADIOLOGY REPORT*  Clinical Data: Fatigue.  Anxiety.  Altered level of consciousness.  CT HEAD WITHOUT CONTRAST  Technique:  Contiguous axial images were obtained from the base of the skull through the vertex without contrast.  Comparison: Head  CT 10/14/2010.  Findings: Cerebral and cerebellar atrophy is age appropriate. There are multiple old lacunar infarctions in the basal ganglia bilaterally, particularly in the region of the head of the caudate bilaterally.  There are extensive patchy and confluent areas of decreased attenuation throughout the deep and periventricular white matter of the cerebral hemispheres bilaterally, most compatible to chronic microvascular ischemic disease.  No definite acute intracranial abnormality.  Specifically, no definite findings of acute/subacute cerebral ischemia, no intracranial hemorrhage, no focal mass, mass effect, hydrocephalus or abnormal intra or extra- axial fluid collections.  Visualized paranasal sinuses and mastoids are well pneumatized, with exception of a small amount of mucosal thickening posteriorly in the right maxillary sinus.  No acute displaced skull fractures are noted.  IMPRESSION: 1.  No acute intracranial abnormalities. 2.  Cerebral and cerebellar atrophy with extensive chronic microvascular ischemic changes in the white matter and old lacunar infarctions in the basal ganglia bilaterally, as above.  Original Report Authenticated By: Florencia Reasons, M.D.      [phone]:  No acute findings on workup today.  No clear reason for admission.  VSS, resps easy.  Hx DM, not acidotic.  Pt has hx of dementia and chronic pruritis.  Pt  reassured she is on multiple creams and PO meds for itching and that I would not be changing these today.  Pt replied, "ok, you can take me home then."  Family (including daugher POA) has now arrived to bedside.  Dx testing, as well as above, d/w family. Questions answered.  Verb understanding.  Family requests I send pt back to the NH.         Laray Anger, DO 05/06/12 1520

## 2012-05-05 NOTE — ED Notes (Signed)
Pt is able to answer some questions appropriately. Pt is anxious.

## 2012-05-05 NOTE — ED Notes (Signed)
Pt from [facility]. cbg today 383. Employee states pt has been nervous and weak x 2 days. Pt c/o weakness all over "i feel rotten and I'm so nervous". No obvious anxiety noted. Pt is alert/oriented to most. Denies pain.

## 2012-05-07 LAB — URINE CULTURE: Colony Count: 25000

## 2012-05-10 ENCOUNTER — Emergency Department (HOSPITAL_COMMUNITY)
Admission: EM | Admit: 2012-05-10 | Discharge: 2012-05-10 | Disposition: A | Discharge status: Skilled Nursing Facility | Payer: Medicare Other | Attending: Emergency Medicine | Admitting: Emergency Medicine

## 2012-05-10 ENCOUNTER — Encounter (HOSPITAL_COMMUNITY): Payer: Self-pay

## 2012-05-10 ENCOUNTER — Emergency Department (HOSPITAL_COMMUNITY): Payer: Medicare Other

## 2012-05-10 DIAGNOSIS — S0003XA Contusion of scalp, initial encounter: Secondary | ICD-10-CM | POA: Insufficient documentation

## 2012-05-10 DIAGNOSIS — M542 Cervicalgia: Secondary | ICD-10-CM

## 2012-05-10 DIAGNOSIS — Z79899 Other long term (current) drug therapy: Secondary | ICD-10-CM | POA: Insufficient documentation

## 2012-05-10 DIAGNOSIS — E119 Type 2 diabetes mellitus without complications: Secondary | ICD-10-CM | POA: Insufficient documentation

## 2012-05-10 DIAGNOSIS — F0390 Unspecified dementia without behavioral disturbance: Secondary | ICD-10-CM | POA: Insufficient documentation

## 2012-05-10 DIAGNOSIS — S0093XA Contusion of unspecified part of head, initial encounter: Secondary | ICD-10-CM

## 2012-05-10 DIAGNOSIS — Y9210 Unspecified residential institution as the place of occurrence of the external cause: Secondary | ICD-10-CM | POA: Insufficient documentation

## 2012-05-10 DIAGNOSIS — I252 Old myocardial infarction: Secondary | ICD-10-CM | POA: Insufficient documentation

## 2012-05-10 DIAGNOSIS — J449 Chronic obstructive pulmonary disease, unspecified: Secondary | ICD-10-CM | POA: Insufficient documentation

## 2012-05-10 DIAGNOSIS — W1809XA Striking against other object with subsequent fall, initial encounter: Secondary | ICD-10-CM | POA: Insufficient documentation

## 2012-05-10 DIAGNOSIS — E785 Hyperlipidemia, unspecified: Secondary | ICD-10-CM | POA: Insufficient documentation

## 2012-05-10 DIAGNOSIS — I10 Essential (primary) hypertension: Secondary | ICD-10-CM | POA: Insufficient documentation

## 2012-05-10 DIAGNOSIS — W19XXXA Unspecified fall, initial encounter: Secondary | ICD-10-CM

## 2012-05-10 DIAGNOSIS — J4489 Other specified chronic obstructive pulmonary disease: Secondary | ICD-10-CM | POA: Insufficient documentation

## 2012-05-10 DIAGNOSIS — Z7982 Long term (current) use of aspirin: Secondary | ICD-10-CM | POA: Insufficient documentation

## 2012-05-10 DIAGNOSIS — G319 Degenerative disease of nervous system, unspecified: Secondary | ICD-10-CM | POA: Insufficient documentation

## 2012-05-10 DIAGNOSIS — Z9089 Acquired absence of other organs: Secondary | ICD-10-CM | POA: Insufficient documentation

## 2012-05-10 MED ORDER — ACETAMINOPHEN 325 MG PO TABS
650.0000 mg | ORAL_TABLET | Freq: Once | ORAL | Status: AC
  Administered 2012-05-10: 650 mg via ORAL
  Filled 2012-05-10: qty 2

## 2012-05-10 NOTE — ED Notes (Signed)
Pt brought to er by ems, she resides at [facility] and was walking down hallway, when "her knees went out on her and she fell", denies any loc, resp even and unlabored.   Pt stated she is only here because nh staff made her come.

## 2012-05-10 NOTE — ED Provider Notes (Signed)
History   This chart was scribed for Ward Givens, MD by Melba Coon. The patient was seen in room APA19/APA19 and the patient's care was started at 6:27PM.    CSN: [id number]  Arrival date & time 05/10/12  1733   First MD Initiated Contact with Patient 05/10/12 1736      Chief Complaint  Patient presents with  . Fall    (Consider location/radiation/quality/duration/timing/severity/associated sxs/prior Treatment)  A Level 5 Caveat applies due to the condition of the patient (dementia).  The history is provided by the patient. The history is limited by the condition of the patient (dementia). No language interpreter was used.   [name] is a 78 y.o. female who EMS presents to the Emergency Department complaining of constant, moderate posterior headache and neck pain pertaining to a fall hitting her posterior head but no LOC with an onset today. Pt has baseline right leg weakness due to past Hx of polio as a child and she states it gives out on her.  Pt fell at [facility] when her knee became weak today and she fell and hit her head on a door.  No fever, sore throat, rash, back pain, CP, SOB, abd pain, n/v/d, dysuria, or extremity pain, edema, numbness, or tingling. No known allergies. No other pertinent medical symptoms.  PCP: Dr. Renard Matter   Past Medical History  Diagnosis Date  . COPD (chronic obstructive pulmonary disease)   . Syncope   . Essential hypertension, benign   . Hyperlipidemia   . Benign positional vertigo   . History of shingles   . Type 2 diabetes mellitus   . Osteoporosis   . Second degree Mobitz II AV block     s/p PPM 11/02/09  . Myocardial infarction   . Near syncope 09/09/2009  . Asthma   . Shortness of breath   . Dementia   . Paranoia   . Chronic pruritus     Past Surgical History  Procedure Date  . Appendectomy   . Insert / replace / remove pacemaker 11/02/09    S/p Medtronic PPM - Dr. Johney Frame  . Cholecystectomy   . Abdominal hysterectomy    . Eye surgery   . Total knee arthroplasty 1980's    Left    Family History  Problem Relation Age of Onset  . Cancer Mother     Stomach  . Cancer Father     Stomach  . Heart disease Neg Hx     Nor sudden cardiac death  . Diabetes Other     Several family members  . Diabetes Sister   . Diabetes Brother   . Diabetes Sister     History  Substance Use Topics  . Smoking status: Former Smoker -- 30 years  . Smokeless tobacco: Not on file  . Alcohol Use: No  lives in NH  OB History    Grav Para Term Preterm Abortions TAB SAB Ect Mult Living                  Review of Systems 10 Systems reviewed and all are negative for acute change except as noted in the HPI.   Allergies  Review of patient's allergies indicates no known allergies.  Home Medications   Current Outpatient Rx  Name Route Sig Dispense Refill  . AMLODIPINE BESYLATE 5 MG PO TABS Oral Take 1 tablet (5 mg total) by mouth daily. 30 tablet 5  . ASPIRIN EC 325 MG PO TBEC Oral Take 325  mg by mouth daily. For pain    . CLOBETASOL PROPIONATE 0.05 % EX CREA Topical Apply 1 application topically 2 (two) times daily.    Marland Kitchen CLONIDINE HCL 0.1 MG PO TABS Oral Take 1 tablet (0.1 mg total) by mouth 2 (two) times daily. 60 tablet   . FLUTICASONE-SALMETEROL 250-50 MCG/DOSE IN AEPB Inhalation Inhale 1 puff into the lungs every 12 (twelve) hours.      . INSULIN ASPART 100 UNIT/ML [place] SOLN Subcutaneous Inject 2-8 Units into the skin as needed. Sliding scale. Scale as follows 200-249 = 2, 250-299 = 4, 300-349 = 6, 350 & up = 8    . INSULIN DETEMIR 100 UNIT/ML [place] SOLN Subcutaneous Inject 50 Units into the skin at bedtime.    Marland Kitchen METFORMIN HCL 500 MG PO TABS Oral Take 500 mg by mouth 2 (two) times daily.    Marland Kitchen OLMESARTAN MEDOXOMIL 40 MG PO TABS Oral Take 40 mg by mouth daily.    Marland Kitchen PANTOPRAZOLE SODIUM 40 MG PO TBEC Oral Take 40 mg by mouth daily.      Marland Kitchen POLYVINYL ALCOHOL-POVIDONE 1.4-0.6 % OP SOLN Both Eyes Place 1 drop into both eyes 4  (four) times daily as needed. For dry eyes    . SERTRALINE HCL 25 MG PO TABS Oral Take 25 mg by mouth daily.      Marland Kitchen FLUTICASONE PROPIONATE 0.05 % EX CREA Topical Apply 1 application topically as needed. For itching    . HYDROXYZINE HCL 50 MG PO TABS Oral Take 50 mg by mouth every 4 (four) hours as needed. For itching    . AVEENO DAILY MOISTURIZER EX LOTN Apply externally Apply 1 application topically 4 (four) times daily as needed. For itching      BP 139/68  Pulse 64  Temp 99.1 F (37.3 C) (Oral)  Resp 20  SpO2 94%  Vital signs normal    Physical Exam  Nursing note and vitals reviewed. Constitutional: She is oriented to person, place, and time. She appears well-developed and well-nourished.  Non-toxic appearance. She does not appear ill. No distress.  HENT:  Head: Normocephalic and atraumatic.  Right Ear: External ear normal.  Left Ear: External ear normal.  Nose: Nose normal. No mucosal edema or rhinorrhea.  Mouth/Throat: Oropharynx is clear and moist and mucous membranes are normal. No dental abscesses or uvula swelling.       Posterior scalp tenderness  Eyes: Conjunctivae and EOM are normal. Pupils are equal, round, and reactive to light.  Neck: Normal range of motion and full passive range of motion without pain. Neck supple. No tracheal deviation present.       Diffuse midline neck tenderness  Cardiovascular: Normal rate, regular rhythm and normal heart sounds.  Exam reveals no gallop and no friction rub.   No murmur heard. Pulmonary/Chest: Effort normal and breath sounds normal. No respiratory distress. She has no wheezes. She has no rhonchi. She has no rales. She exhibits no tenderness and no crepitus.  Abdominal: Soft. Normal appearance and bowel sounds are normal. She exhibits no distension. There is no tenderness. There is no rebound and no guarding.  Musculoskeletal: Normal range of motion. She exhibits no edema and no tenderness.       Moves all extremities well.     Neurological: She is alert and oriented to person, place, and time. She has normal strength. No cranial nerve deficit.  Skin: Skin is warm, dry and intact. No rash noted. No erythema. No pallor.  No abrasions at site of head injury noted.  Psychiatric: She has a normal mood and affect. Her speech is normal and behavior is normal. Her mood appears not anxious.    ED Course  Procedures (including critical care time)  Medications  acetaminophen (TYLENOL) tablet 650 mg (650 mg Oral Given 05/10/12 1833)     DIAGNOSTIC STUDIES: Oxygen Saturation is 94% on room air, adequate by my interpretation.    COORDINATION OF CARE:  6:32PM - tylenol, head CT, C-spine CT, and applied ice to the head injury will be ordered for the pt.  Pt remains neurologically intact during her ED visit.   Labs Reviewed - No data to display Ct Head Wo Contrast  05/10/2012  *RADIOLOGY REPORT*  Clinical Data:  Fall, right head injury, possible loss of consciousness  CT HEAD WITHOUT CONTRAST CT CERVICAL SPINE WITHOUT CONTRAST  Technique:  Multidetector CT imaging of the head and cervical spine was performed following the standard protocol without intravenous contrast.  Multiplanar CT image reconstructions of the cervical spine were also generated.  Comparison:  CT head dated 05/05/2012  CT HEAD  Findings: No evidence of parenchymal hemorrhage or extra-axial fluid collection. No mass lesion, mass effect, or midline shift.  No CT evidence of acute infarction.  Extensive subcortical white matter and periventricular small vessel ischemic changes. Intracranial atherosclerosis.  Global cortical atrophy.  No ventriculomegaly.  Mild mucosal thickening in the bilateral ethmoid sinuses. Visualized paranasal sinuses are essentially clear.  No evidence of calvarial fracture.  IMPRESSION: No evidence of acute intracranial abnormality.  Atrophy with small vessel ischemic changes and intracranial atherosclerosis.  CT CERVICAL SPINE   Findings: Motion degraded images.  No evidence of fracture or dislocation.  Vertebral body heights are maintained.  The dens appears intact.  No prevertebral soft tissue swelling.  Moderate multilevel degenerative changes.  Visualized thyroid is mildly heterogeneous.  Visualized lung apices are essentially clear.  IMPRESSION: Motion degraded images.  No evidence of traumatic injury to the cervical spine.  Original Report Authenticated By: Charline Bills, M.D.   Ct Cervical Spine Wo Contrast  05/10/2012  *RADIOLOGY REPORT*  Clinical Data:  Fall, right head injury, possible loss of consciousness  CT HEAD WITHOUT CONTRAST CT CERVICAL SPINE WITHOUT CONTRAST  Technique:  Multidetector CT imaging of the head and cervical spine was performed following the standard protocol without intravenous contrast.  Multiplanar CT image reconstructions of the cervical spine were also generated.  Comparison:  CT head dated 05/05/2012  CT HEAD  Findings: No evidence of parenchymal hemorrhage or extra-axial fluid collection. No mass lesion, mass effect, or midline shift.  No CT evidence of acute infarction.  Extensive subcortical white matter and periventricular small vessel ischemic changes. Intracranial atherosclerosis.  Global cortical atrophy.  No ventriculomegaly.  Mild mucosal thickening in the bilateral ethmoid sinuses. Visualized paranasal sinuses are essentially clear.  No evidence of calvarial fracture.  IMPRESSION: No evidence of acute intracranial abnormality.  Atrophy with small vessel ischemic changes and intracranial atherosclerosis.  CT CERVICAL SPINE  Findings: Motion degraded images.  No evidence of fracture or dislocation.  Vertebral body heights are maintained.  The dens appears intact.  No prevertebral soft tissue swelling.  Moderate multilevel degenerative changes.  Visualized thyroid is mildly heterogeneous.  Visualized lung apices are essentially clear.  IMPRESSION: Motion degraded images.  No evidence of  traumatic injury to the cervical spine.  Original Report Authenticated By: Charline Bills, M.D.     1. Fall   2. Contusion of head  3. Neck pain    Plan discharge  Devoria Albe, MD, FACEP    MDM  I personally performed the services described in this documentation, which was scribed in my presence. The recorded information has been reviewed and considered.  Devoria Albe, MD, Armando Gang        Ward Givens, MD 05/10/12 2005

## 2012-05-10 NOTE — ED Notes (Signed)
Placed ice pack to back of head. Patient stated "that feels better."

## 2012-05-10 NOTE — ED Notes (Signed)
Pt from [facility] by ems, was up walking "my knees gave way and i fell", denies any complaints, facility mad eher come to get checked out

## 2012-05-10 NOTE — ED Notes (Signed)
Patient requested that I call her daughter, gave me number to call. Attempted to call, lady stated it was the wrong number. Advised patient. Patient does not know another number to call.

## 2012-05-14 ENCOUNTER — Emergency Department (HOSPITAL_COMMUNITY)
Admission: EM | Admit: 2012-05-14 | Discharge: 2012-05-14 | Disposition: A | Discharge status: Skilled Nursing Facility | Payer: Medicare Other | Attending: Emergency Medicine | Admitting: Emergency Medicine

## 2012-05-14 ENCOUNTER — Emergency Department (HOSPITAL_COMMUNITY): Payer: Medicare Other

## 2012-05-14 ENCOUNTER — Encounter (HOSPITAL_COMMUNITY): Payer: Self-pay

## 2012-05-14 DIAGNOSIS — J4489 Other specified chronic obstructive pulmonary disease: Secondary | ICD-10-CM | POA: Insufficient documentation

## 2012-05-14 DIAGNOSIS — R5381 Other malaise: Secondary | ICD-10-CM | POA: Insufficient documentation

## 2012-05-14 DIAGNOSIS — I252 Old myocardial infarction: Secondary | ICD-10-CM | POA: Insufficient documentation

## 2012-05-14 DIAGNOSIS — E119 Type 2 diabetes mellitus without complications: Secondary | ICD-10-CM | POA: Insufficient documentation

## 2012-05-14 DIAGNOSIS — Z79899 Other long term (current) drug therapy: Secondary | ICD-10-CM | POA: Insufficient documentation

## 2012-05-14 DIAGNOSIS — Z9089 Acquired absence of other organs: Secondary | ICD-10-CM | POA: Insufficient documentation

## 2012-05-14 DIAGNOSIS — M542 Cervicalgia: Secondary | ICD-10-CM | POA: Insufficient documentation

## 2012-05-14 DIAGNOSIS — R51 Headache: Secondary | ICD-10-CM | POA: Insufficient documentation

## 2012-05-14 DIAGNOSIS — E785 Hyperlipidemia, unspecified: Secondary | ICD-10-CM | POA: Insufficient documentation

## 2012-05-14 DIAGNOSIS — Z794 Long term (current) use of insulin: Secondary | ICD-10-CM | POA: Insufficient documentation

## 2012-05-14 DIAGNOSIS — J449 Chronic obstructive pulmonary disease, unspecified: Secondary | ICD-10-CM | POA: Insufficient documentation

## 2012-05-14 DIAGNOSIS — Z87891 Personal history of nicotine dependence: Secondary | ICD-10-CM | POA: Insufficient documentation

## 2012-05-14 DIAGNOSIS — R531 Weakness: Secondary | ICD-10-CM

## 2012-05-14 DIAGNOSIS — I10 Essential (primary) hypertension: Secondary | ICD-10-CM | POA: Insufficient documentation

## 2012-05-14 HISTORY — DX: Urinary tract infection, site not specified: N39.0

## 2012-05-14 LAB — BASIC METABOLIC PANEL
BUN: 20 mg/dL (ref 6–23)
CO2: 29 mEq/L (ref 19–32)
Calcium: 10.5 mg/dL (ref 8.4–10.5)
GFR calc non Af Amer: 50 mL/min — ABNORMAL LOW (ref >90)
Glucose, Bld: 204 mg/dL — ABNORMAL HIGH (ref 70–99)
Potassium: 4.9 mEq/L (ref 3.5–5.1)
Sodium: 136 mEq/L (ref 135–145)

## 2012-05-14 LAB — URINALYSIS, ROUTINE W REFLEX MICROSCOPIC
Glucose, UA: 250 mg/dL — AB
Leukocytes, UA: NEGATIVE
pH: 6 (ref 5.0–8.0)

## 2012-05-14 LAB — PROTIME-INR: Prothrombin Time: 13.6 seconds (ref 11.6–15.2)

## 2012-05-14 LAB — CBC WITH DIFFERENTIAL/PLATELET
Eosinophils Absolute: 0.2 10*3/uL (ref 0.0–0.7)
Eosinophils Relative: 1 % (ref 0–5)
Hemoglobin: 12.6 g/dL (ref 12.0–15.0)
Lymphocytes Relative: 11 % — ABNORMAL LOW (ref 12–46)
Lymphs Abs: 1.4 10*3/uL (ref 0.7–4.0)
MCH: 26 pg (ref 26.0–34.0)
MCV: 84.5 fL (ref 78.0–100.0)
Monocytes Relative: 8 % (ref 3–12)
Platelets: 297 10*3/uL (ref 150–400)
RBC: 4.84 MIL/uL (ref 3.87–5.11)
WBC: 13.5 10*3/uL — ABNORMAL HIGH (ref 4.0–10.5)

## 2012-05-14 LAB — APTT: aPTT: 31 seconds (ref 24–37)

## 2012-05-14 LAB — URINE MICROSCOPIC-ADD ON

## 2012-05-14 MED ORDER — SODIUM CHLORIDE 0.9 % IV SOLN
Freq: Once | INTRAVENOUS | Status: AC
  Administered 2012-05-14: 15:00:00 via INTRAVENOUS

## 2012-05-14 NOTE — ED Notes (Signed)
Pt fell x 2 days ago seen and treated here.  Reports worsening pain since.  C/o pain to head, neck and back.  No obvious bruising/deformities noted.  Reports has not been sleeping well at night due to pain.  Alert at this time, family at bedside.

## 2012-05-14 NOTE — ED Notes (Signed)
EMS reports pt resident of [facility] and fell 2 days ago.  EMS says daughter reports pt has not been "acting right."  Reports pt has been weak and shakey.  EMS also says pt was put on hydrocodone recently.

## 2012-05-14 NOTE — ED Notes (Signed)
Spoke with [name] at [facility] regarding pt coming back to facility.

## 2012-05-14 NOTE — ED Provider Notes (Signed)
History   This chart was scribed for Geoffery Lyons, MD by Charolett Bumpers . The patient was seen in room APA11/APA11. Patient's care was started at 1446.    CSN: [id number]  Arrival date & time 05/14/12  1349   First MD Initiated Contact with Patient 05/14/12 1446      Chief Complaint  Patient presents with  . Weakness  . Shaking    (Consider location/radiation/quality/duration/timing/severity/associated sxs/prior treatment) HPI Comments: Per EMS, pt is a resident of [facility] and fell 2 days ago in which she was seen here in ED. Daughter reports that the pt has been "weak and shakey". Daughter states that the pt is normally able to ambulate with a walker, but currently has to use a wheelchair. Pt states that her right hand is shaky. Pt reports that she has head and neck pain after a fall a week ago. Per EMS, pt was put on hydrocodone recently. Pt denies any h/o heart problems. Pt has a h/o diabetes, over 200 and 300. Pt denies taking any anticoagulants. Daughter states that the pt has a pacemaker. Pt reports a h/o dementia.   Patient is a 78 y.o. female presenting with weakness. The history is provided by the patient and a relative.  Weakness The primary symptoms include headaches. Primary symptoms do not include fever or vomiting. The symptoms began 2 days ago. The symptoms are worsening. The neurological symptoms are diffuse.  The headache is associated with weakness.  Additional symptoms include weakness. Medical issues also include diabetes.    Past Medical History  Diagnosis Date  . COPD (chronic obstructive pulmonary disease)   . Syncope   . Essential hypertension, benign   . Hyperlipidemia   . Benign positional vertigo   . History of shingles   . Type 2 diabetes mellitus   . Osteoporosis   . Second degree Mobitz II AV block     s/p PPM 11/02/09  . Myocardial infarction   . Near syncope 09/09/2009  . Asthma   . Shortness of breath   . Dementia   . Paranoia   .  Chronic pruritus   . Urinary tract infection     Past Surgical History  Procedure Date  . Appendectomy   . Insert / replace / remove pacemaker 11/02/09    S/p Medtronic PPM - Dr. Johney Frame  . Cholecystectomy   . Abdominal hysterectomy   . Eye surgery   . Total knee arthroplasty 1980's    Left    Family History  Problem Relation Age of Onset  . Cancer Mother     Stomach  . Cancer Father     Stomach  . Heart disease Neg Hx     Nor sudden cardiac death  . Diabetes Other     Several family members  . Diabetes Sister   . Diabetes Brother   . Diabetes Sister     History  Substance Use Topics  . Smoking status: Former Smoker -- 30 years  . Smokeless tobacco: Not on file  . Alcohol Use: No    OB History    Grav Para Term Preterm Abortions TAB SAB Ect Mult Living                  Review of Systems  Constitutional: Negative for fever.  HENT: Positive for neck pain.   Gastrointestinal: Negative for vomiting and diarrhea.  Genitourinary: Negative for dysuria.  Neurological: Positive for weakness and headaches.  All other systems reviewed and are negative.  Allergies  Review of patient's allergies indicates no known allergies.  Home Medications   Current Outpatient Rx  Name Route Sig Dispense Refill  . AMLODIPINE BESYLATE 5 MG PO TABS Oral Take 1 tablet (5 mg total) by mouth daily. 30 tablet 5  . ASPIRIN EC 325 MG PO TBEC Oral Take 325 mg by mouth daily. For pain    . CLOBETASOL PROPIONATE 0.05 % EX CREA Topical Apply 1 application topically 2 (two) times daily as needed. Itching    . CLONIDINE HCL 0.1 MG PO TABS Oral Take 1 tablet (0.1 mg total) by mouth 2 (two) times daily. 60 tablet   . FLUTICASONE-SALMETEROL 250-50 MCG/DOSE IN AEPB Inhalation Inhale 1 puff into the lungs every 12 (twelve) hours.      . INSULIN DETEMIR 100 UNIT/ML [place] SOLN Subcutaneous Inject 50 Units into the skin at bedtime.    Marland Kitchen METFORMIN HCL 500 MG PO TABS Oral Take 500 mg by mouth 2 (two)  times daily.    Marland Kitchen OLMESARTAN MEDOXOMIL 40 MG PO TABS Oral Take 40 mg by mouth daily.    Marland Kitchen PANTOPRAZOLE SODIUM 40 MG PO TBEC Oral Take 40 mg by mouth daily.      Marland Kitchen POLYVINYL ALCOHOL-POVIDONE 1.4-0.6 % OP SOLN Both Eyes Place 1 drop into both eyes 4 (four) times daily as needed. For dry eyes    . SERTRALINE HCL 25 MG PO TABS Oral Take 25 mg by mouth daily.        BP 154/61  Pulse 61  Temp 98.4 F (36.9 C) (Oral)  Resp 18  Ht 5\' 4"  (1.626 m)  Wt 219 lb (99.338 kg)  BMI 37.59 kg/m2  SpO2 95%  Physical Exam  Nursing note and vitals reviewed. Constitutional: She is oriented to person, place, and time. She appears well-developed and well-nourished. No distress.  HENT:  Head: Normocephalic and atraumatic.  Eyes: EOM are normal. Pupils are equal, round, and reactive to light.  Neck: Neck supple. No tracheal deviation present.  Cardiovascular: Normal rate, regular rhythm and normal heart sounds.   Pulmonary/Chest: Effort normal and breath sounds normal. No respiratory distress. She has no wheezes.  Abdominal: Soft. Bowel sounds are normal. She exhibits no distension. There is no tenderness. There is no rebound and no guarding.  Musculoskeletal: Normal range of motion. She exhibits edema.       1+ edema in ankles bilaterally.   Neurological: She is alert and oriented to person, place, and time. No sensory deficit.  Skin: Skin is warm and dry.  Psychiatric: She has a normal mood and affect. Her behavior is normal.    ED Course  Procedures (including critical care time)  DIAGNOSTIC STUDIES: Oxygen Saturation is 95% on room air, adequate by my interpretation.    COORDINATION OF CARE:  14:57-Discussed planned course of treatment with the patient and family, who are agreeable at this time.      Labs Reviewed  CBC WITH DIFFERENTIAL  BASIC METABOLIC PANEL  TROPONIN I   No results found.   No diagnosis found.   Date: 05/14/2012  Rate: 60  Rhythm: Ventricular paced  QRS Axis:  indeterminate  Intervals: normal  ST/T Wave abnormalities: indeterminate  Conduction Disutrbances:none  Narrative Interpretation:   Old EKG Reviewed: unchanged    MDM  The patient presents with weakness and feeling shaky for the past several days.  I am unable to find a cause for this in the workup.  She seems fine to me and the vitals  and labs are all unremarkable.  She appears stable for discharge, to return prn.     I personally performed the services described in this documentation, which was scribed in my presence. The recorded information has been reviewed and considered.         Geoffery Lyons, MD 05/14/12 1640

## 2012-06-26 ENCOUNTER — Ambulatory Visit (INDEPENDENT_AMBULATORY_CARE_PROVIDER_SITE_OTHER): Payer: Medicare Other | Admitting: *Deleted

## 2012-06-26 ENCOUNTER — Ambulatory Visit (INDEPENDENT_AMBULATORY_CARE_PROVIDER_SITE_OTHER): Payer: Medicare Other | Admitting: Physician Assistant

## 2012-06-26 ENCOUNTER — Encounter: Payer: Self-pay | Admitting: Physician Assistant

## 2012-06-26 VITALS — BP 120/68 | HR 63 | Ht 64.0 in | Wt 220.0 lb

## 2012-06-26 DIAGNOSIS — I4891 Unspecified atrial fibrillation: Secondary | ICD-10-CM

## 2012-06-26 DIAGNOSIS — R0989 Other specified symptoms and signs involving the circulatory and respiratory systems: Secondary | ICD-10-CM

## 2012-06-26 DIAGNOSIS — I441 Atrioventricular block, second degree: Secondary | ICD-10-CM

## 2012-06-26 DIAGNOSIS — I10 Essential (primary) hypertension: Secondary | ICD-10-CM

## 2012-06-26 LAB — PACEMAKER DEVICE OBSERVATION
AL AMPLITUDE: 0.5 mv
AL IMPEDENCE PM: 436 Ohm
BAMS-0001: 150 {beats}/min
BATTERY VOLTAGE: 2.8 V
BRDY-0004RV: 130 {beats}/min
RV LEAD IMPEDENCE PM: 555 Ohm
RV LEAD THRESHOLD: 1.25 V

## 2012-06-26 NOTE — Assessment & Plan Note (Signed)
Pacer functioning normally. To follow up with Dr. Ladona Ridgel in December.

## 2012-06-26 NOTE — Progress Notes (Signed)
Pacer check in clinic  

## 2012-06-26 NOTE — Assessment & Plan Note (Signed)
Patient has a right carotid bruit. She has not had Dopplers and over a year. We will recheck

## 2012-06-26 NOTE — Assessment & Plan Note (Signed)
Patient was found to be in atrial fibrillation today on pacer check. She is asymptomatic with this. She has had multiple mode switches in the past. She had a recent emergency room visit for a fall which makes Coumadin a high-risk drug for her. Dr. Ladona Ridgel will review her pacer check and make a decision on further treatment. She will be seen in the [facility] office since she is living here now.

## 2012-06-26 NOTE — Assessment & Plan Note (Signed)
controlled 

## 2012-06-26 NOTE — Progress Notes (Signed)
HPI:   This is a 78 year old female patient who is here for her yearly checkup who usually sees Dr. Johney Frame in our [place] office but now lives at [facility] and would like to be seen in this office.She has a permanent pacemaker since 2011 for second degree Mobitz 2 AV block. She denies any cardiac complaints such as chest pain, palpitations, dizziness, or presyncope. She is wheelchair-bound and does not walk at all. She seems a little confused at times and doesn't feel well in general today because she has a headache. She has chronic leg edema according to the aide that is with her.  She has been seen in the emergency room a multiple visits in August for weakness, pruritis, and a fall which required a CT scan.she was also in the emergency room last December in which her blood pressure was out of control but is now stable on medication.  No Known Allergies  Current Outpatient Prescriptions on File Prior to Visit: aspirin EC 325 MG tablet, Take 325 mg by mouth daily. For pain, Disp: , Rfl:  clobetasol cream (TEMOVATE) 0.05 %, Apply 1 application topically 2 (two) times daily as needed. Itching, Disp: , Rfl:  cloNIDine (CATAPRES) 0.1 MG tablet, Take 1 tablet (0.1 mg total) by mouth 2 (two) times daily., Disp: 60 tablet, Rfl:  Fluticasone-Salmeterol (ADVAIR) 250-50 MCG/DOSE AEPB, Inhale 1 puff into the lungs every 12 (twelve) hours.  , Disp: , Rfl:  insulin detemir (LEVEMIR FLEXPEN) 100 UNIT/ML injection, Inject 40 Units into the skin at bedtime. , Disp: , Rfl:  metFORMIN (GLUCOPHAGE) 500 MG tablet, Take 500 mg by mouth 2 (two) times daily., Disp: , Rfl:  olmesartan (BENICAR) 40 MG tablet, Take 40 mg by mouth daily., Disp: , Rfl:   pantoprazole (PROTONIX) 40 MG tablet, Take 40 mg by mouth daily.  , Disp: , Rfl:  sertraline (ZOLOFT) 25 MG tablet, Take 25 mg by mouth daily.  , Disp: , Rfl:  polyvinyl alcohol-povidone (REFRESH) 1.4-0.6 % ophthalmic solution, Place 1 drop into both eyes 4  (four) times daily as needed. For dry eyes, Disp: , Rfl:     Past Medical History:   COPD (chronic obstructive pulmonary disease)                 Syncope                                                      Essential hypertension, benign                               Hyperlipidemia                                               Benign positional vertigo                                    History of shingles  Type 2 diabetes mellitus                                     Osteoporosis                                                 Second degree Mobitz II AV block                               Comment:s/p PPM 11/02/09   Myocardial infarction                                        Near syncope                                    09/09/2009    Asthma                                                       Shortness of breath                                          Dementia                                                     Paranoia                                                     Chronic pruritus                                             Urinary tract infection                                     Past Surgical History:   APPENDECTOMY                                                 INSERT / REPLACE / REMOVE PACEMAKER             11/02/09         Comment:S/p Medtronic PPM - Dr. Johney Frame   CHOLECYSTECTOMY  ABDOMINAL HYSTERECTOMY                                       EYE SURGERY                                                  TOTAL KNEE ARTHROPLASTY                         1980's         Comment:Left  Review of patient's family history indicates:   Cancer                         Mother                     Comment: Stomach   Cancer                         Father                     Comment: Stomach   Heart disease                  Neg Hx                     Comment: Nor sudden cardiac death   Diabetes                        Other                      Comment: Several family members   Diabetes                       Sister                   Diabetes                       Brother                  Diabetes                       Sister                   Social History   Marital Status: Widowed             Spouse Name:                      Years of Education:                 Number of children:             Occupational History Occupation          Psychiatric nurse              Retired  Social History Main Topics   Smoking Status: Former Smoker                   Packs/Day:       Years: 30      Smokeless Status: Not on file                      Alcohol Use: No             Drug Use: No             Sexual Activity: No                 Other Topics            Concern   None on file  Social History Narrative   Lives in [place].   Has 5 children   One son lives next door to her.   Has a daughter that goes to store for groceries and picks up prescriptions.    ROS:see history of present illness otherwise negative   PHYSICAL EXAM: Well-nournished, in no acute distress. Neck:Right carotid bruit, No JVD, HJR, or thyroid enlargement  Lungs: decreased breath sounds with some scattered crackles,No tachypnea, clear without wheezing, rales, or rhonchi  Cardiovascular: RRR, PMI not displaced,1/6 systolic murmur at the left sternal border, no gallops, bruit, thrill, or heave.  Abdomen: BS normal. Soft without organomegaly, masses, lesions or tenderness.  Extremities:+1 edema on the left mild edema on the right, otherwise lower extremities without cyanosis, clubbing . Good distal pulses bilateral  SKin: Warm, no lesions or rashes   Musculoskeletal: No deformities  Neuro: no focal signs  BP 120/68  Pulse 63  Ht 5\' 4"  (1.626 m)  Wt 220 lb (99.791 kg)  BMI 37.76 kg/m2  SpO2 92%   EKG:EKG from 05/14/12 showed paced rhythm   Pacer check today  shows that the patient has been in atrial fibrillation since 06/25/12. This is new for this patient. She has had many mode switches in the past. Study Conclusions  2Decho 2011  - Left ventricle: The estimated ejection fraction was 65%. Images    were inadequate for LV wall motion assessment.  - Aortic valve: Sclerosis without stenosis. Valve area:    2.38cm^2(VTI). Valve area: 1.87cm^2 (Vmax).  Impressions:   - The study is technically limited. However, there is very good LV    function. Clinical Data: History of hypertension and previous MI, presenting this admission with acute mental status changes.   BILATERAL CAROTID DUPLEX ULTRASOUND 10/15/2010:   RIGHT CAROTID ARTERY: Intimal thickening involving the CCA.  Mild predominately noncalcified plaque in the carotid bulb and proximal ICA.  No visible stenosis of greater than 50% diameter at gray scale or color imaging.  Mild spectral broadening involving the ICA waveform.   RIGHT VERTEBRAL ARTERY:  Antegrade flow with normal waveform.   LEFT CAROTID ARTERY: Intimal thickening involving the CCA.  Mild predominately noncalcified plaque in the carotid bulb and proximal ICA.  No visible stenosis of greater than 50% diameter at gray scale or color imaging.  Mild spectral broadening involving the ICA waveform.   LEFT VERTEBRAL ARTERY:  Antegrade flow with normal waveform.   IMPRESSION:   1.  No evidence of hemodynamically significant stenosis involving either the right or left carotid circulation in the neck by Doppler criteria. 2.  Antegrade flow in both vertebral arteries. 3.  Mild predominately noncalcified plaque in the carotid bulbs and proximal ICAs

## 2012-06-26 NOTE — Patient Instructions (Addendum)
Your physician recommends that you schedule a follow-up appointment in: December 2nd @ 8:45 with Dr Ladona Ridgel  Your physician has requested that you have a carotid duplex. This test is an ultrasound of the carotid arteries in your neck. It looks at blood flow through these arteries that supply the brain with blood. Allow one hour for this exam. There are no restrictions or special instructions.

## 2012-06-28 ENCOUNTER — Ambulatory Visit (HOSPITAL_COMMUNITY): Payer: Medicare Other

## 2012-07-10 ENCOUNTER — Ambulatory Visit (HOSPITAL_COMMUNITY)
Admission: RE | Admit: 2012-07-10 | Discharge: 2012-07-10 | Disposition: A | Discharge status: Home / Self Care | Payer: Medicare Other | Source: Ambulatory Visit | Attending: Physician Assistant | Admitting: Physician Assistant

## 2012-07-10 DIAGNOSIS — R0989 Other specified symptoms and signs involving the circulatory and respiratory systems: Secondary | ICD-10-CM | POA: Insufficient documentation

## 2012-07-11 ENCOUNTER — Telehealth: Payer: Self-pay | Admitting: *Deleted

## 2012-07-11 NOTE — Telephone Encounter (Signed)
Notes Recorded by Dyann Kief, PA on 07/11/2012 at 7:59 AM Normal carotids  07/11/12--Lyndsey at [facility] notified of results. Copy has been sent to Dr Renard Matter at her request.

## 2012-07-12 ENCOUNTER — Telehealth: Payer: Self-pay | Admitting: *Deleted

## 2012-07-12 NOTE — Telephone Encounter (Signed)
Carotid Ultrasound results called to [facility]. Copy to PCP

## 2012-07-22 ENCOUNTER — Encounter: Payer: Medicare Other | Admitting: Internal Medicine

## 2012-08-13 ENCOUNTER — Encounter: Payer: Self-pay | Admitting: *Deleted

## 2012-08-13 DIAGNOSIS — Z950 Presence of cardiac pacemaker: Secondary | ICD-10-CM | POA: Insufficient documentation

## 2012-08-22 ENCOUNTER — Encounter: Payer: Medicare Other | Admitting: Internal Medicine

## 2012-09-02 ENCOUNTER — Encounter: Payer: Medicare Other | Admitting: Internal Medicine

## 2012-09-18 ENCOUNTER — Encounter: Payer: Medicare Other | Admitting: Internal Medicine

## 2012-10-29 ENCOUNTER — Encounter: Payer: Self-pay | Admitting: *Deleted

## 2012-11-03 IMAGING — CR DG CERVICAL SPINE COMPLETE 4+V
6 series · 6 of 6 positions shown · non-contrast
Comparison: None.

CLINICAL DATA: Neck pain, no recent injury, fell several weeks ago

CERVICAL SPINE - COMPLETE 4+ VIEW

[view not recorded (1 of 6)]
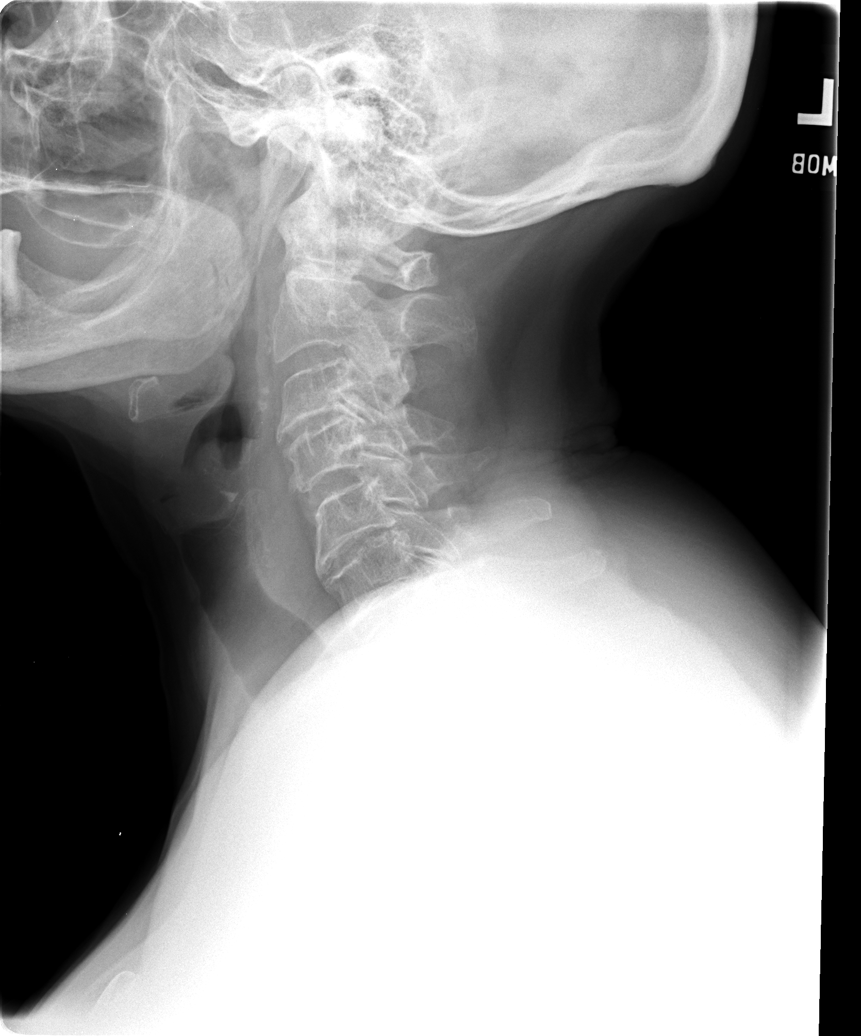

[view not recorded (2 of 6)]
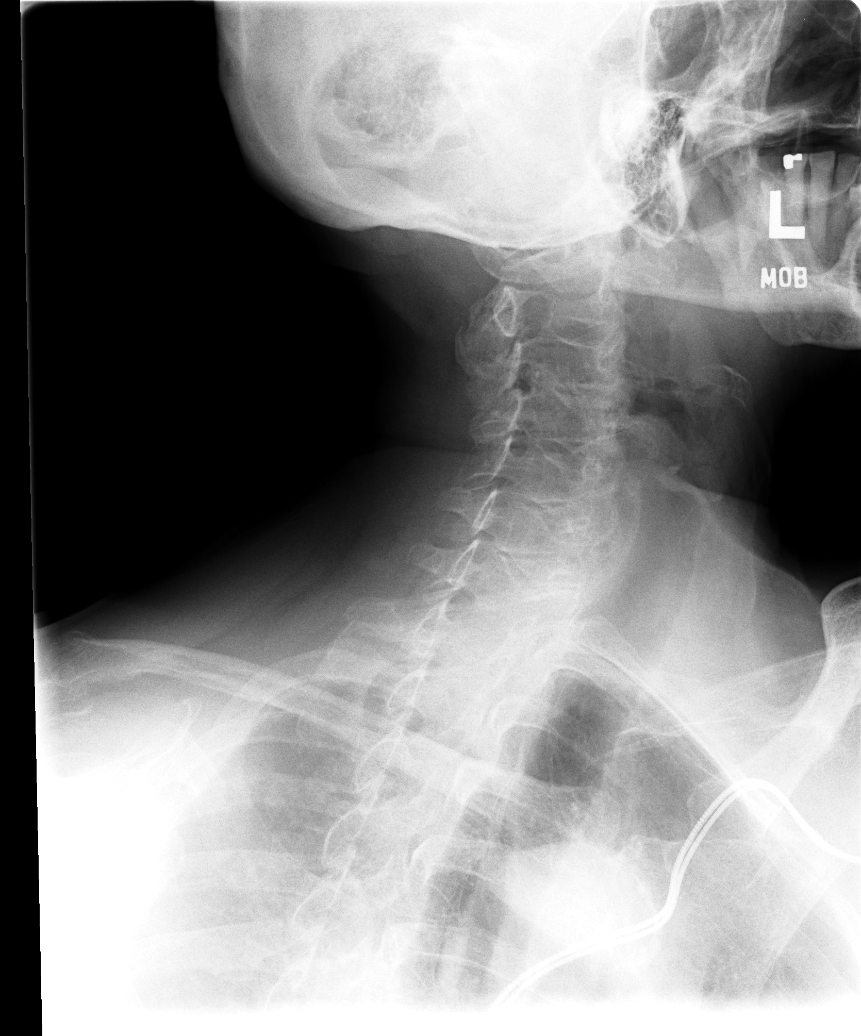

[view not recorded (3 of 6)]
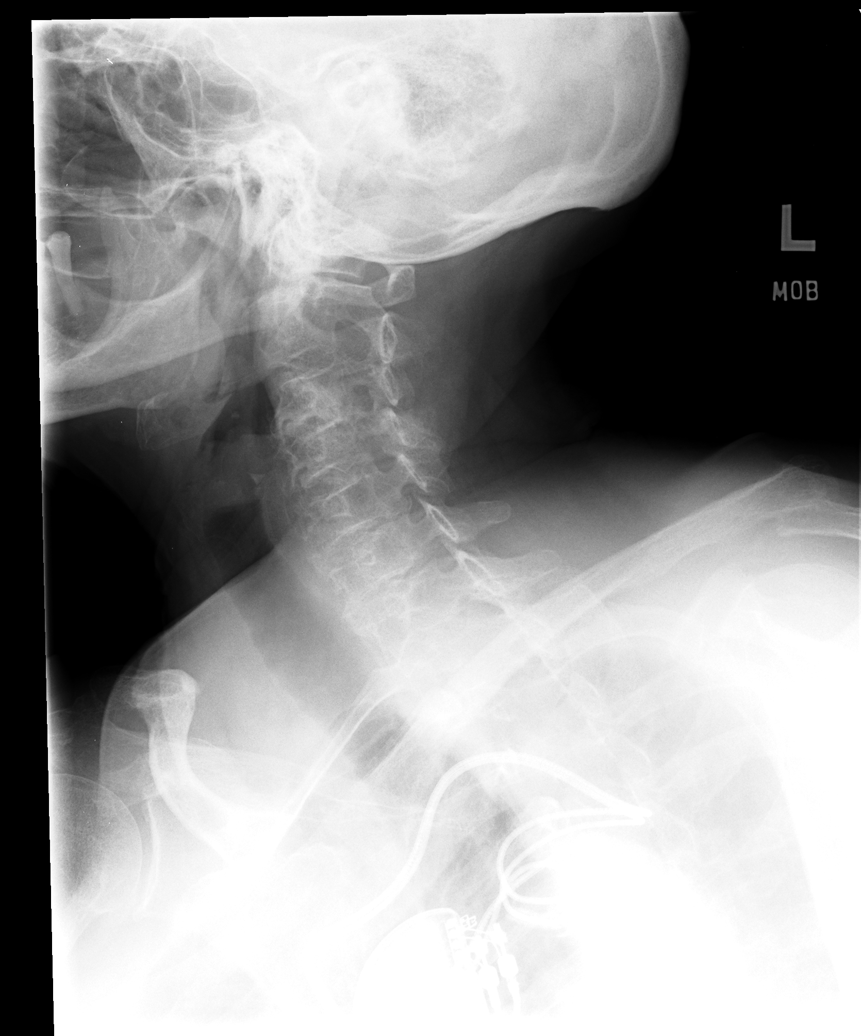

[view not recorded (4 of 6)]
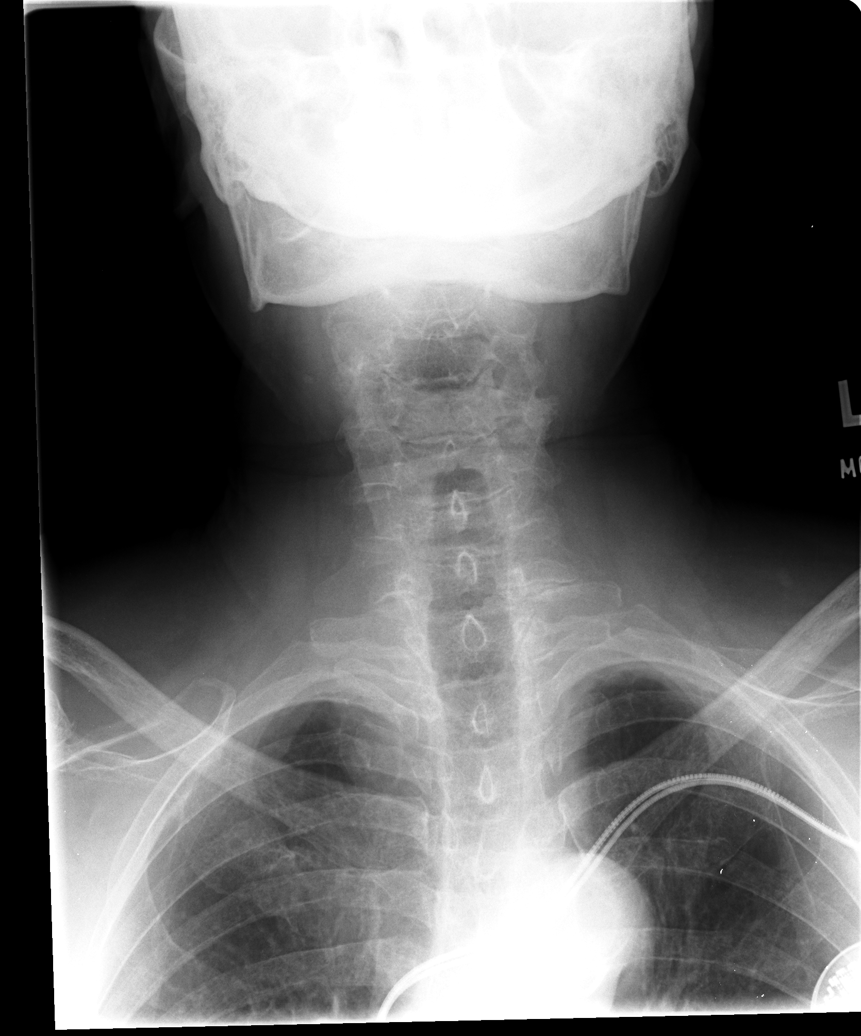

[view not recorded (5 of 6)]
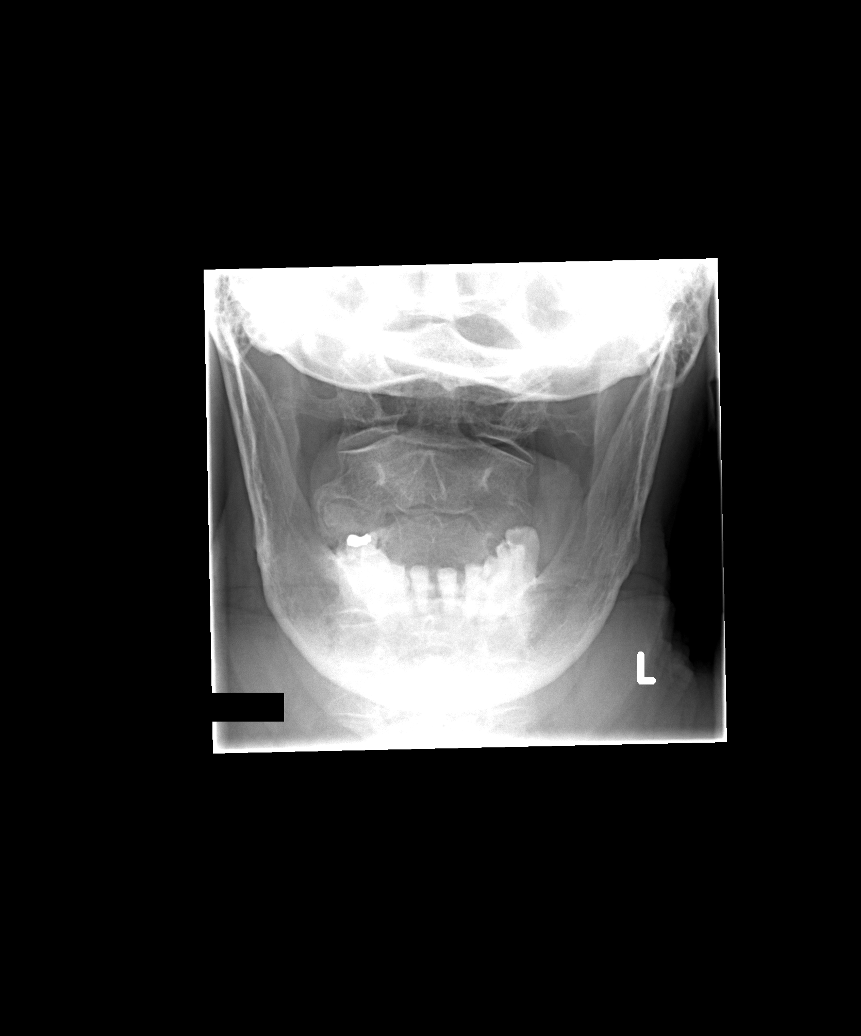

[view not recorded (6 of 6)]
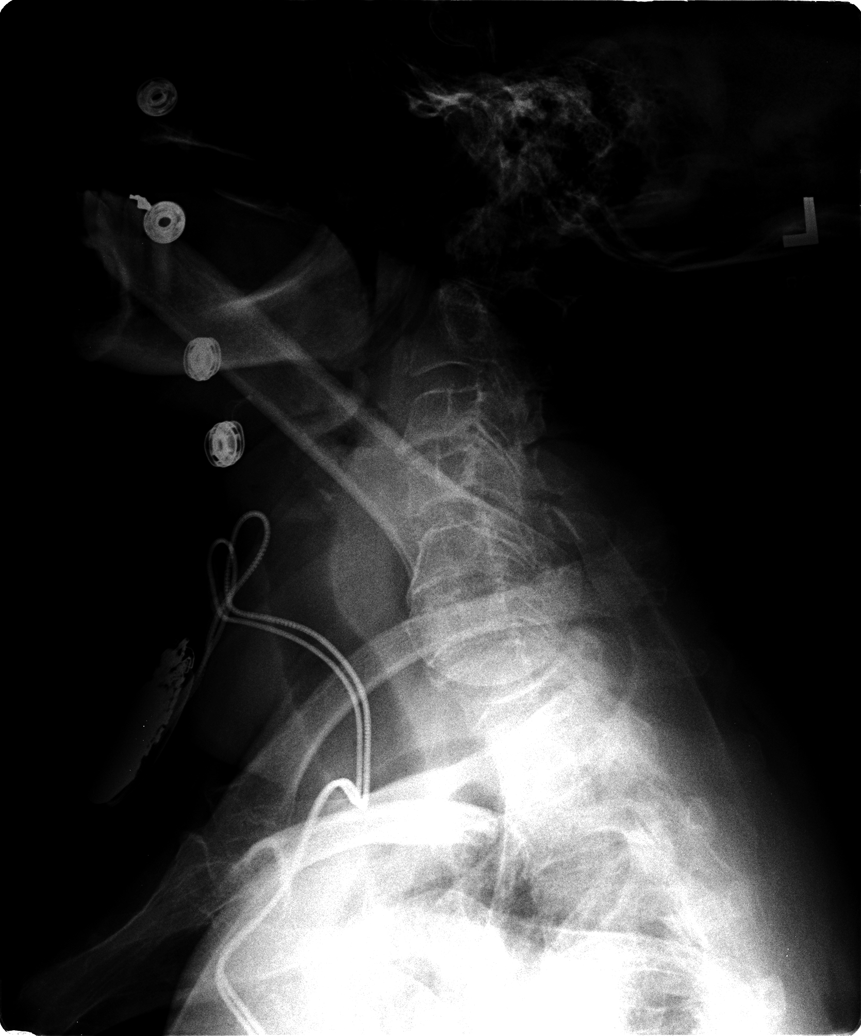

[6 of 6 positions shown; findings below may reference images not displayed]

FINDINGS: The cervical vertebrae are in normal alignment.  There is
degenerative disc disease at C3-4, C5-6, and C6-7 levels with loss
of disc space and spurring.  No acute fracture is seen.  No
prevertebral soft tissue swelling is noted.  No significant
foraminal narrowing is seen although there is mild narrowing at C3-
4 and C5-6 levels.  The odontoid process is intact.  The lung
apices are clear.
IMPRESSION: Degenerative disc disease at C3-4, C5-6 and C6-7 with some
foraminal narrowing at C [DATE] and C5-6 levels.

## 2012-12-28 ENCOUNTER — Encounter: Payer: Self-pay | Admitting: Cardiology

## 2012-12-30 ENCOUNTER — Encounter: Payer: Self-pay | Admitting: Cardiology

## 2012-12-31 ENCOUNTER — Encounter: Payer: Self-pay | Admitting: Cardiology

## 2012-12-31 DIAGNOSIS — R0602 Shortness of breath: Secondary | ICD-10-CM

## 2013-01-01 DIAGNOSIS — I5033 Acute on chronic diastolic (congestive) heart failure: Secondary | ICD-10-CM

## 2013-01-02 ENCOUNTER — Encounter: Payer: Self-pay | Admitting: Cardiology

## 2013-01-05 ENCOUNTER — Encounter: Payer: Self-pay | Admitting: Cardiology

## 2013-01-06 ENCOUNTER — Encounter: Payer: Self-pay | Admitting: Physician Assistant

## 2013-01-06 DIAGNOSIS — I5033 Acute on chronic diastolic (congestive) heart failure: Secondary | ICD-10-CM

## 2013-01-15 ENCOUNTER — Encounter: Payer: Self-pay | Admitting: Internal Medicine

## 2013-02-11 ENCOUNTER — Encounter: Payer: Medicare Other | Admitting: Cardiology

## 2013-02-20 ENCOUNTER — Encounter: Payer: Medicare Other | Admitting: Cardiology

## 2013-03-03 ENCOUNTER — Encounter: Payer: Self-pay | Admitting: Cardiology

## 2013-03-03 DIAGNOSIS — I10 Essential (primary) hypertension: Secondary | ICD-10-CM | POA: Insufficient documentation

## 2013-03-03 DIAGNOSIS — E119 Type 2 diabetes mellitus without complications: Secondary | ICD-10-CM | POA: Insufficient documentation

## 2013-03-03 DIAGNOSIS — H8110 Benign paroxysmal vertigo, unspecified ear: Secondary | ICD-10-CM | POA: Insufficient documentation

## 2013-03-03 DIAGNOSIS — I350 Nonrheumatic aortic (valve) stenosis: Secondary | ICD-10-CM | POA: Insufficient documentation

## 2013-03-03 DIAGNOSIS — R9430 Abnormal result of cardiovascular function study, unspecified: Secondary | ICD-10-CM | POA: Insufficient documentation

## 2013-03-03 DIAGNOSIS — J449 Chronic obstructive pulmonary disease, unspecified: Secondary | ICD-10-CM | POA: Insufficient documentation

## 2013-03-03 DIAGNOSIS — R0989 Other specified symptoms and signs involving the circulatory and respiratory systems: Secondary | ICD-10-CM | POA: Insufficient documentation

## 2013-03-03 DIAGNOSIS — I2510 Atherosclerotic heart disease of native coronary artery without angina pectoris: Secondary | ICD-10-CM | POA: Insufficient documentation

## 2013-03-03 DIAGNOSIS — R55 Syncope and collapse: Secondary | ICD-10-CM | POA: Insufficient documentation

## 2013-03-03 DIAGNOSIS — I272 Pulmonary hypertension, unspecified: Secondary | ICD-10-CM | POA: Insufficient documentation

## 2013-03-07 ENCOUNTER — Encounter: Payer: Self-pay | Admitting: Cardiology

## 2013-03-07 ENCOUNTER — Ambulatory Visit (INDEPENDENT_AMBULATORY_CARE_PROVIDER_SITE_OTHER): Payer: Medicare Other | Admitting: Cardiology

## 2013-03-07 VITALS — BP 126/73 | HR 68 | Ht 64.0 in | Wt 211.0 lb

## 2013-03-07 DIAGNOSIS — I2510 Atherosclerotic heart disease of native coronary artery without angina pectoris: Secondary | ICD-10-CM

## 2013-03-07 DIAGNOSIS — J449 Chronic obstructive pulmonary disease, unspecified: Secondary | ICD-10-CM

## 2013-03-07 DIAGNOSIS — I359 Nonrheumatic aortic valve disorder, unspecified: Secondary | ICD-10-CM

## 2013-03-07 DIAGNOSIS — I509 Heart failure, unspecified: Secondary | ICD-10-CM

## 2013-03-07 DIAGNOSIS — I350 Nonrheumatic aortic (valve) stenosis: Secondary | ICD-10-CM

## 2013-03-07 DIAGNOSIS — I4891 Unspecified atrial fibrillation: Secondary | ICD-10-CM

## 2013-03-07 DIAGNOSIS — I10 Essential (primary) hypertension: Secondary | ICD-10-CM

## 2013-03-07 DIAGNOSIS — I5032 Chronic diastolic (congestive) heart failure: Secondary | ICD-10-CM

## 2013-03-07 DIAGNOSIS — Z950 Presence of cardiac pacemaker: Secondary | ICD-10-CM

## 2013-03-07 MED ORDER — FUROSEMIDE 40 MG PO TABS: 40.0000 mg | ORAL_TABLET | Freq: Every day | ORAL | Status: DC

## 2013-03-07 NOTE — Assessment & Plan Note (Signed)
We know that she has moderate aortic stenosis by echo March, 2014. This will be treated medically by managing her other problems.

## 2013-03-07 NOTE — Assessment & Plan Note (Signed)
Her pacemaker is followed by our team and we will be sure that there is appropriate followup.

## 2013-03-07 NOTE — Assessment & Plan Note (Signed)
The patient is fluid overloaded. I will be increasing her Lasix dose. We will see her for followup over time.

## 2013-03-07 NOTE — Assessment & Plan Note (Signed)
The patient has significant dementia although she can carry on a conversation.

## 2013-03-07 NOTE — Assessment & Plan Note (Signed)
There is history of an MI in 2010. However she has good LV function. No further workup.

## 2013-03-07 NOTE — Assessment & Plan Note (Signed)
Blood pressure is controlled. No change in therapy. 

## 2013-03-07 NOTE — Patient Instructions (Addendum)
Your physician recommends that you schedule a follow-up appointment in: 3 months. Your physician has recommended you make the following change in your medication: Increase furosemide to 40 mg daily. All other medications will remain the same.

## 2013-03-07 NOTE — Assessment & Plan Note (Signed)
The patient has known COPD. Appears stable today.

## 2013-03-07 NOTE — Progress Notes (Signed)
HPI  The patient is seen post hospitalization at [facility] in April, 2014. I did not do that consultation. I have reviewed the hospital records. I have reviewed our prior records. I have updated the current electronic medical record. Historically the patient needed a pacemaker for second-degree AV block. She lives in a nursing home and she does have significant dementia. She has good left ventricular function. There is a history of hypertension.  Most recently she was seen in consultation at [facility] on December 31, 2012. She was admitted with shortness of breath. Echo revealed ejection fraction of 60%. It was felt that she probably had an exacerbation of her COPD. She does have moderate aortic stenosis. Her Lasix dose was to have been increased by the recommendation of cardiology. Currently she is on Lasix only once daily.  Allergies  Allergen Reactions  . Sulfa Antibiotics Swelling    Current Outpatient Prescriptions  Medication Sig Dispense Refill  . acetaminophen (TYLENOL) 500 MG tablet Take 1,000 mg by mouth every 4 (four) hours as needed for pain.      Marland Kitchen aluminum-magnesium hydroxide-simethicone (MAALOX) 200-200-20 MG/5ML SUSP Take 30 mLs by mouth 4 (four) times daily as needed.      Marland Kitchen aspirin EC 81 MG tablet Take 81 mg by mouth daily.      Marland Kitchen atorvastatin (LIPITOR) 10 MG tablet Take 10 mg by mouth daily.      . cloNIDine (CATAPRES) 0.1 MG tablet Take 0.1 mg by mouth 2 (two) times daily.      . diphenhydrAMINE (BENADRYL) 25 mg capsule Take 25 mg by mouth every 4 (four) hours as needed for itching.      . docusate sodium (COLACE) 100 MG capsule Take 100 mg by mouth 2 (two) times daily.      . Fluticasone-Salmeterol (ADVAIR) 250-50 MCG/DOSE AEPB Inhale 1 puff into the lungs every 12 (twelve) hours.        . furosemide (LASIX) 20 MG tablet Take 20 mg by mouth daily.      Marland Kitchen guaiFENesin (ROBITUSSIN) 100 MG/5ML liquid Take 200 mg by mouth 4 (four) times daily as needed for cough.       . insulin detemir (LEVEMIR FLEXPEN) 100 UNIT/ML injection Inject 25 Units into the skin at bedtime.       . insulin lispro (HUMALOG) 100 UNIT/ML injection Inject into the skin as directed. Per sliding scale      . Ipratropium-Albuterol (COMBIVENT) 20-100 MCG/ACT AERS respimat Inhale 1 puff into the lungs every 6 (six) hours.      Marland Kitchen loperamide (IMODIUM) 2 MG capsule Take 2 mg by mouth 2 (two) times daily as needed for diarrhea or loose stools.      Marland Kitchen losartan (COZAAR) 100 MG tablet Take 100 mg by mouth daily.      . magnesium hydroxide (MILK OF MAGNESIA) 400 MG/5ML suspension Take 30 mLs by mouth 2 (two) times daily as needed for constipation.      . metFORMIN (GLUCOPHAGE) 500 MG tablet Take 500 mg by mouth 2 (two) times daily.      . nitrofurantoin, macrocrystal-monohydrate, (MACROBID) 100 MG capsule Take 100 mg by mouth 2 (two) times daily.      . pantoprazole (PROTONIX) 40 MG tablet Take 40 mg by mouth daily.       . potassium chloride (K-DUR,KLOR-CON) 10 MEQ tablet Take 10 mEq by mouth daily.      . rivastigmine (EXELON) 4.6 mg/24hr Place 1 patch onto the skin daily.      Marland Kitchen  sertraline (ZOLOFT) 25 MG tablet Take 25 mg by mouth daily.        . traZODone (DESYREL) 50 MG tablet Take 25 mg by mouth at bedtime.       No current facility-administered medications for this visit.    History   Social History  . Marital Status: Widowed    Spouse Name: N/A    Number of Children: N/A  . Years of Education: N/A   Occupational History  . Retired    Social History Main Topics  . Smoking status: Former Smoker -- 0.50 packs/day for 30 years    Types: Cigarettes    Quit date: 10/02/1984  . Smokeless tobacco: Never Used  . Alcohol Use: No  . Drug Use: No  . Sexually Active: No   Other Topics Concern  . Not on file   Social History Narrative   Lives in [place].   Has 5 children   One son lives next door to her.   Has a daughter that goes to store for groceries and picks up  prescriptions.    Family History  Problem Relation Age of Onset  . Cancer Mother     Stomach  . Cancer Father     Stomach  . Heart disease Neg Hx     Nor sudden cardiac death  . Diabetes Other     Several family members  . Diabetes Sister   . Diabetes Brother   . Diabetes Sister     Past Medical History  Diagnosis Date  . COPD (chronic obstructive pulmonary disease)   . Essential hypertension, benign   . Hyperlipidemia   . Benign positional vertigo   . History of shingles   . Type 2 diabetes mellitus   . Osteoporosis   . Second degree Mobitz II AV block     s/p PPM 11/02/09  . Near syncope 09/09/2009  . Asthma   . Shortness of breath   . Dementia   . Paranoia   . Chronic pruritus   . Urinary tract infection   . Pacemaker     Dr. Johney Frame follows, second-degree AV block  . Ejection fraction     EF 65%, echo, 2011  //  EF 60-65%, echo, March, 2014  . Carotid bruit     Dopplers, January, 2012, no hemodynamically significant stenosis of the carotid arteries  . Aortic stenosis     Moderate, echo, March, 2014, mean gradient 17 mm mercury  . Pulmonary hypertension     PA pressure 50-55 mmHg, echo, March, 2014  . CAD (coronary artery disease)     History of an MI 2010    Past Surgical History  Procedure Laterality Date  . Appendectomy    . Insert / replace / remove pacemaker  11/02/09    S/p Medtronic PPM - Dr. Johney Frame  . Cholecystectomy    . Abdominal hysterectomy    . Eye surgery    . Total knee arthroplasty  1980's    Left    Patient Active Problem List   Diagnosis Date Noted  . Aortic stenosis   . CAD (coronary artery disease)   . Carotid bruit   . Ejection fraction   . COPD (chronic obstructive pulmonary disease)   . Syncope   . Essential hypertension, benign   . Benign positional vertigo   . Type 2 diabetes mellitus   . Pulmonary hypertension   . Pacemaker-Medtronic 08/13/2012  . Atrial fibrillation 06/26/2012  . UTI (lower urinary tract infection)  09/09/2011  . FTT (failure to thrive) in adult 09/09/2011  . Medically noncompliant 09/09/2011  . Dementia 09/09/2011  . Second degree Mobitz II AV block 05/15/2011  . DM 09/09/2009  . MIXED HYPERLIPIDEMIA 09/09/2009  . HYPERTENSION 09/09/2009  . Near syncope 09/09/2009    ROS     The patient's review of systems is not reliable. She seems to answer questions appropriately. However when I recommended a change in her medicine she says she does not take any medicines. This clearly is not the case. Her daughter is in the rheumatic knowledges that there is significant dementia. I am not aware of any fevers, chills, headache, sweats, rash, change in vision, change in hearing, chest pain, no cough, nausea or vomiting. She does say that she has shortness of breath at nighttime.  PHYSICAL EXAM  The patient is comfortable in a wheelchair. She does show signs of dementia. Lungs reveal decreased breath sounds. There are a few expiratory wheezes. Cardiac exam reveals S1 and S2. There is a systolic murmur although it is distant.. The abdomen is soft. She has a history of polio. Her right leg is much smaller than the left. She has edema in both legs. There no skin rashes.    Filed Vitals:   03/07/13 1426  BP: 126/73  Pulse: 68  Height: 5\' 4"  (1.626 m)  Weight: 211 lb (95.709 kg)     ASSESSMENT & PLAN

## 2013-06-13 ENCOUNTER — Ambulatory Visit (INDEPENDENT_AMBULATORY_CARE_PROVIDER_SITE_OTHER): Payer: Medicare Other | Admitting: Cardiology

## 2013-06-13 ENCOUNTER — Encounter: Payer: Self-pay | Admitting: Cardiology

## 2013-06-13 ENCOUNTER — Ambulatory Visit: Payer: Medicare Other | Admitting: Cardiology

## 2013-06-13 VITALS — BP 114/69 | HR 59 | Ht 64.0 in | Wt 224.0 lb

## 2013-06-13 DIAGNOSIS — I350 Nonrheumatic aortic (valve) stenosis: Secondary | ICD-10-CM

## 2013-06-13 DIAGNOSIS — I5032 Chronic diastolic (congestive) heart failure: Secondary | ICD-10-CM

## 2013-06-13 DIAGNOSIS — I4891 Unspecified atrial fibrillation: Secondary | ICD-10-CM

## 2013-06-13 DIAGNOSIS — I359 Nonrheumatic aortic valve disorder, unspecified: Secondary | ICD-10-CM

## 2013-06-13 DIAGNOSIS — I509 Heart failure, unspecified: Secondary | ICD-10-CM

## 2013-06-13 MED ORDER — FUROSEMIDE 80 MG PO TABS: 80.0000 mg | ORAL_TABLET | Freq: Two times a day (BID) | ORAL | Status: DC

## 2013-06-13 NOTE — Assessment & Plan Note (Signed)
The patient had some atrial fib at one point. She is not a candidate for Coumadin. Her current rate is controlled.

## 2013-06-13 NOTE — Assessment & Plan Note (Signed)
Patient has significant diastolic heart failure with volume overload. Her aortic stenosis plays a role. I will increase her Lasix dose further. Will be increased to 80 mg twice a day until her edema begins to come down. She will need followup chemistry labs. Also we must find a way to have her feet elevated during the day.

## 2013-06-13 NOTE — Assessment & Plan Note (Signed)
We know that the patient has moderate aortic stenosis. It is not severe. She is not a candidate for any type of procedure for her aortic valve.

## 2013-06-13 NOTE — Patient Instructions (Addendum)
Your physician recommends that you schedule a follow-up appointment in: 3 months. You will receive a reminder letter in the mail in about 1-2 months reminding you to call and schedule your appointment. If you don't receive this letter, please contact our office. Your physician has recommended you make the following change in your medication: Increase your lasix to 80 mg twice daily. All other medications will remain the same. Your physician recommends that you have lab work in 1 week for [employer]. YOU MAY HAVE THIS DONE AT THE NURSING HOME. PLEASE SEND RESULTS TO DR. [facility] OFFICE FAX # [phone]. PLEASE KEEP YOUR FEET ELEVATED AT ALL TIMES EVEN WHILE SITTING IN WHEEL CHAIR.

## 2013-06-13 NOTE — Progress Notes (Signed)
HPI  Patient is seen today to followup moderate aortic stenosis. She's having ongoing problems with edema and volume overload. The dose of her diuretic was increased to 40 twice a day at her nursing home. Despite this she still has significant edema. Her fluid intake has been restricted. We think that her salt is being limited within reason. She does sit in a wheelchair a good bit of the day. At this time it appears that her feet are dependent and are not being Up.  Allergies  Allergen Reactions  . Sulfa Antibiotics Swelling    Current Outpatient Prescriptions  Medication Sig Dispense Refill  . amLODipine (NORVASC) 5 MG tablet Take 5 mg by mouth daily.      Marland Kitchen aspirin EC 81 MG tablet Take 81 mg by mouth daily.      Marland Kitchen atorvastatin (LIPITOR) 10 MG tablet Take 10 mg by mouth daily.      . benztropine (COGENTIN) 1 MG tablet Take 1 mg by mouth daily.      . beta carotene w/minerals (OCUVITE) tablet Take 1 tablet by mouth daily.      . cloNIDine (CATAPRES) 0.1 MG tablet Take 0.1 mg by mouth 2 (two) times daily.      Marland Kitchen docusate sodium (COLACE) 100 MG capsule Take 100 mg by mouth 2 (two) times daily.      . Fluticasone-Salmeterol (ADVAIR) 250-50 MCG/DOSE AEPB Inhale 1 puff into the lungs every 12 (twelve) hours.        . furosemide (LASIX) 40 MG tablet Take 40 mg by mouth 2 (two) times daily.      . insulin detemir (LEVEMIR FLEXPEN) 100 UNIT/ML injection Inject 25 Units into the skin at bedtime.       . insulin lispro (HUMALOG) 100 UNIT/ML injection Inject into the skin as directed. Per sliding scale      . LORazepam (ATIVAN) 2 MG/ML concentrated solution Take 1 mg by mouth every 6 (six) hours as needed.      Marland Kitchen losartan (COZAAR) 100 MG tablet Take 100 mg by mouth daily.      . metFORMIN (GLUCOPHAGE) 500 MG tablet Take 500 mg by mouth 2 (two) times daily.      Marland Kitchen OLANZapine (ZYPREXA) 5 MG tablet Take 5 mg by mouth 2 (two) times daily.      . pantoprazole (PROTONIX) 40 MG tablet Take 40 mg by mouth  daily.       . potassium chloride (K-DUR,KLOR-CON) 10 MEQ tablet Take 10 mEq by mouth daily.      . rivastigmine (EXELON) 4.6 mg/24hr Place 1 patch onto the skin daily.      Marland Kitchen senna (SENOKOT) 8.6 MG tablet Take 2 tablets by mouth daily.      . sertraline (ZOLOFT) 25 MG tablet Take 25 mg by mouth daily.        Marland Kitchen tiotropium (SPIRIVA) 18 MCG inhalation capsule Place 18 mcg into inhaler and inhale daily.      . traZODone (DESYREL) 50 MG tablet Take 25 mg by mouth at bedtime.       No current facility-administered medications for this visit.    History   Social History  . Marital Status: Widowed    Spouse Name: N/A    Number of Children: N/A  . Years of Education: N/A   Occupational History  . Retired    Social History Main Topics  . Smoking status: Former Smoker -- 0.50 packs/day for 30 years    Types: Cigarettes  Quit date: 10/02/1984  . Smokeless tobacco: Never Used  . Alcohol Use: No  . Drug Use: No  . Sexual Activity: No   Other Topics Concern  . Not on file   Social History Narrative   Lives in [place].   Has 5 children   One son lives next door to her.   Has a daughter that goes to store for groceries and picks up prescriptions.    Family History  Problem Relation Age of Onset  . Cancer Mother     Stomach  . Cancer Father     Stomach  . Heart disease Neg Hx     Nor sudden cardiac death  . Diabetes Other     Several family members  . Diabetes Sister   . Diabetes Brother   . Diabetes Sister     Past Medical History  Diagnosis Date  . COPD (chronic obstructive pulmonary disease)   . Essential hypertension, benign   . Hyperlipidemia   . Benign positional vertigo   . History of shingles   . Type 2 diabetes mellitus   . Osteoporosis   . Second degree Mobitz II AV block     s/p PPM 11/02/09  . Near syncope 09/09/2009  . Asthma   . Shortness of breath   . Dementia   . Paranoia   . Chronic pruritus   . Urinary tract infection   . Pacemaker     Dr.  Johney Frame follows, second-degree AV block  . Ejection fraction     EF 65%, echo, 2011  //  EF 60-65%, echo, March, 2014  . Carotid bruit     Dopplers, January, 2012, no hemodynamically significant stenosis of the carotid arteries  . Aortic stenosis     Moderate, echo, March, 2014, mean gradient 17 mm mercury  . Pulmonary hypertension     PA pressure 50-55 mmHg, echo, March, 2014  . CAD (coronary artery disease)     History of an MI 2010    Past Surgical History  Procedure Laterality Date  . Appendectomy    . Insert / replace / remove pacemaker  11/02/09    S/p Medtronic PPM - Dr. Johney Frame  . Cholecystectomy    . Abdominal hysterectomy    . Eye surgery    . Total knee arthroplasty  1980's    Left    Patient Active Problem List   Diagnosis Date Noted  . Chronic diastolic CHF (congestive heart failure) 03/07/2013  . Aortic stenosis   . CAD (coronary artery disease)   . Carotid bruit   . Ejection fraction   . COPD (chronic obstructive pulmonary disease)   . Syncope   . Essential hypertension, benign   . Benign positional vertigo   . Type 2 diabetes mellitus   . Pulmonary hypertension   . Pacemaker-Medtronic 08/13/2012  . Atrial fibrillation 06/26/2012  . UTI (lower urinary tract infection) 09/09/2011  . FTT (failure to thrive) in adult 09/09/2011  . Medically noncompliant 09/09/2011  . Dementia 09/09/2011  . Second degree Mobitz II AV block 05/15/2011  . DM 09/09/2009  . MIXED HYPERLIPIDEMIA 09/09/2009  . HYPERTENSION 09/09/2009  . Near syncope 09/09/2009    ROS   Patient denies fever, chills, headache, sweats, rash, change in vision, change in hearing, chest pain, cough, nausea vomiting, urinary symptoms. All other systems are reviewed and are negative.  PHYSICAL EXAM  Patient is here with her daughter. She is unhappy with the swelling in her legs. There is no  jugulovenous distention. She is overweight. Lungs reveal scattered rhonchi. There is no respiratory distress.  Cardiac exam reveals S1 and S2. There is a crescendo decrescendo systolic murmur. Abdomen is soft. The patient has 2+ peripheral edema.  Filed Vitals:   06/13/13 1504  BP: 114/69  Pulse: 59  Height: 5\' 4"  (1.626 m)  Weight: 224 lb (101.606 kg)  SpO2: 95%     ASSESSMENT & PLAN

## 2013-06-24 ENCOUNTER — Ambulatory Visit: Payer: Medicare Other | Admitting: Cardiology

## 2013-09-22 ENCOUNTER — Encounter: Payer: Self-pay | Admitting: Cardiology

## 2013-09-22 ENCOUNTER — Ambulatory Visit (INDEPENDENT_AMBULATORY_CARE_PROVIDER_SITE_OTHER): Payer: Medicare Other | Admitting: Cardiology

## 2013-09-22 VITALS — BP 108/60 | HR 61 | Ht 64.0 in

## 2013-09-22 DIAGNOSIS — I509 Heart failure, unspecified: Secondary | ICD-10-CM

## 2013-09-22 DIAGNOSIS — I10 Essential (primary) hypertension: Secondary | ICD-10-CM

## 2013-09-22 DIAGNOSIS — I350 Nonrheumatic aortic (valve) stenosis: Secondary | ICD-10-CM

## 2013-09-22 DIAGNOSIS — I4891 Unspecified atrial fibrillation: Secondary | ICD-10-CM

## 2013-09-22 DIAGNOSIS — I5032 Chronic diastolic (congestive) heart failure: Secondary | ICD-10-CM

## 2013-09-22 DIAGNOSIS — Z950 Presence of cardiac pacemaker: Secondary | ICD-10-CM

## 2013-09-22 DIAGNOSIS — I359 Nonrheumatic aortic valve disorder, unspecified: Secondary | ICD-10-CM

## 2013-09-22 NOTE — Assessment & Plan Note (Signed)
Atrial fib rate is controlled. There is significant fall risk. She is not a Coumadin candidate.

## 2013-09-22 NOTE — Assessment & Plan Note (Signed)
Blood pressure is controlled. No change in therapy. 

## 2013-09-22 NOTE — Assessment & Plan Note (Signed)
It appears the with a higher dose of Lasix her renal function is stable. Her volume status is under better control with this. No change in therapy today.

## 2013-09-22 NOTE — Patient Instructions (Signed)
Your physician recommends that you schedule a follow-up appointment in: 6 months. You will receive a reminder letter in the mail in about 4 months reminding you to call and schedule your appointment. If you don't receive this letter, please contact our office. Your physician recommends that you continue on your current medications as directed. Please refer to the Current Medication list given to you today. You will need to be scheduled in the device clinic. Keep feet elevated several times daily. Limit Salt and Fluid intake.

## 2013-09-22 NOTE — Assessment & Plan Note (Signed)
We are checking the records to be sure that she has appropriate pacemaker followup.

## 2013-09-22 NOTE — Progress Notes (Signed)
HPI  The patient is seen today to followup her fluid status and her aortic stenosis. I saw her last September, 2014. At that time she had significant edema. We talked about restricting salt and fluid. I did increase her diuretic dose. Her lab was checked after that time at the nursing home. Fortunately her renal function was stable in October, 2014. The papers from the nursing home today show that she also had labs in December, 2014. Once again her renal function is stable with a creatinine in the range of 1.1. She's not having any chest pain. She has some discomfort in her left knee.  Allergies  Allergen Reactions  . Sulfa Antibiotics Swelling    Current Outpatient Prescriptions  Medication Sig Dispense Refill  . acetaminophen (TYLENOL) 500 MG tablet Take 500 mg by mouth every 6 (six) hours as needed.      Marland Kitchen aluminum-magnesium hydroxide-simethicone (MAALOX) 200-200-20 MG/5ML SUSP Take 30 mLs by mouth 4 (four) times daily -  before meals and at bedtime.      Marland Kitchen amLODipine (NORVASC) 5 MG tablet Take 5 mg by mouth daily.      Marland Kitchen aspirin EC 81 MG tablet Take 81 mg by mouth daily.      Marland Kitchen atorvastatin (LIPITOR) 10 MG tablet Take 10 mg by mouth daily.      . benztropine (COGENTIN) 1 MG tablet Take 1 mg by mouth daily.      . beta carotene w/minerals (OCUVITE) tablet Take 1 tablet by mouth daily.      . cloNIDine (CATAPRES) 0.1 MG tablet Take 0.1 mg by mouth 2 (two) times daily.      . diphenhydrAMINE (BENADRYL) 25 mg capsule Take 25 mg by mouth every 6 (six) hours as needed.      . docusate sodium (COLACE) 100 MG capsule Take 100 mg by mouth 2 (two) times daily.      . Fluticasone-Salmeterol (ADVAIR) 250-50 MCG/DOSE AEPB Inhale 1 puff into the lungs every 12 (twelve) hours.        . furosemide (LASIX) 80 MG tablet Take 1 tablet (80 mg total) by mouth 2 (two) times daily.  180 tablet  3  . guaiFENesin (ROBITUSSIN) 100 MG/5ML liquid Take 200 mg by mouth 3 (three) times daily as needed for cough.       . insulin aspart (NOVOLOG) 100 UNIT/ML injection Inject into the skin 2 (two) times daily.      . insulin detemir (LEVEMIR FLEXPEN) 100 UNIT/ML injection Inject 25 Units into the skin at bedtime.       Marland Kitchen loperamide (IMODIUM) 2 MG capsule Take 2 mg by mouth as needed for diarrhea or loose stools.      Marland Kitchen LORazepam (ATIVAN) 2 MG/ML concentrated solution Take 1 mg by mouth every 6 (six) hours as needed.      Marland Kitchen losartan (COZAAR) 100 MG tablet Take 100 mg by mouth daily.      . magnesium hydroxide (MILK OF MAGNESIA) 400 MG/5ML suspension Take 30 mLs by mouth daily as needed for mild constipation.      . metFORMIN (GLUCOPHAGE) 500 MG tablet Take 500 mg by mouth 2 (two) times daily.      Marland Kitchen OLANZapine (ZYPREXA) 5 MG tablet Take 5 mg by mouth 2 (two) times daily.      . pantoprazole (PROTONIX) 40 MG tablet Take 40 mg by mouth daily.       . potassium chloride (K-DUR,KLOR-CON) 10 MEQ tablet Take 10 mEq by  mouth daily.      . rivastigmine (EXELON) 4.6 mg/24hr Place 1 patch onto the skin daily.      Marland Kitchen. senna (SENOKOT) 8.6 MG tablet Take 2 tablets by mouth daily.      . sertraline (ZOLOFT) 25 MG tablet Take 25 mg by mouth daily.        Marland Kitchen. tiotropium (SPIRIVA) 18 MCG inhalation capsule Place 18 mcg into inhaler and inhale daily.      . traMADol (ULTRAM) 50 MG tablet Take 50 mg by mouth every 8 (eight) hours as needed.      . traZODone (DESYREL) 50 MG tablet Take 25 mg by mouth at bedtime.       No current facility-administered medications for this visit.    History   Social History  . Marital Status: Widowed    Spouse Name: N/A    Number of Children: N/A  . Years of Education: N/A   Occupational History  . Retired    Social History Main Topics  . Smoking status: Former Smoker -- 0.50 packs/day for 30 years    Types: Cigarettes    Quit date: 10/02/1984  . Smokeless tobacco: Never Used  . Alcohol Use: No  . Drug Use: No  . Sexual Activity: No   Other Topics Concern  . Not on file   Social  History Narrative   [place]   Has 5 children   One son lives next door to her.   Has a daughter that goes to store for groceries and picks up prescriptions.    Family History  Problem Relation Age of Onset  . Cancer Mother     Stomach  . Cancer Father     Stomach  . Heart disease Neg Hx     Nor sudden cardiac death  . Diabetes Other     Several family members  . Diabetes Sister   . Diabetes Brother   . Diabetes Sister     Past Medical History  Diagnosis Date  . COPD (chronic obstructive pulmonary disease)   . Essential hypertension, benign   . Hyperlipidemia   . Benign positional vertigo   . History of shingles   . Type 2 diabetes mellitus   . Osteoporosis   . Second degree Mobitz II AV block     s/p PPM 11/02/09  . Near syncope 09/09/2009  . Asthma   . Shortness of breath   . Dementia   . Paranoia   . Chronic pruritus   . Urinary tract infection   . Pacemaker     Dr. Johney FrameAllred follows, second-degree AV block  . Ejection fraction     EF 65%, echo, 2011  //  EF 60-65%, echo, March, 2014  . Carotid bruit     Dopplers, January, 2012, no hemodynamically significant stenosis of the carotid arteries  . Aortic stenosis     Moderate, echo, March, 2014, mean gradient 17 mm mercury  . Pulmonary hypertension     PA pressure 50-55 mmHg, echo, March, 2014  . CAD (coronary artery disease)     History of an MI 2010    Past Surgical History  Procedure Laterality Date  . Appendectomy    . Insert / replace / remove pacemaker  11/02/09    S/p Medtronic PPM - Dr. Johney FrameAllred  . Cholecystectomy    . Abdominal hysterectomy    . Eye surgery    . Total knee arthroplasty  1980's    Left  Patient Active Problem List   Diagnosis Date Noted  . Chronic diastolic CHF (congestive heart failure) 03/07/2013  . Aortic stenosis   . CAD (coronary artery disease)   . Carotid bruit   . Ejection fraction   . COPD (chronic obstructive pulmonary disease)   . Syncope   . Essential  hypertension, benign   . Benign positional vertigo   . Type 2 diabetes mellitus   . Pulmonary hypertension   . Pacemaker-Medtronic 08/13/2012  . Atrial fibrillation 06/26/2012  . UTI (lower urinary tract infection) 09/09/2011  . FTT (failure to thrive) in adult 09/09/2011  . Medically noncompliant 09/09/2011  . Dementia 09/09/2011  . Second degree Mobitz II AV block 05/15/2011  . DM 09/09/2009  . MIXED HYPERLIPIDEMIA 09/09/2009  . HYPERTENSION 09/09/2009  . Near syncope 09/09/2009    ROS   Patient denies fever, chills, headache, sweats, rash, change in vision, change in hearing, chest pain, cough, nausea vomiting, urinary symptoms. All other systems are reviewed and are negative.  PHYSICAL EXAM  Patient's here with her family member. She is oriented to person time and place. Her affect is normal. She is overweight. She is in a wheelchair. There is no jugulovenous distention. Lungs are clear. Cardiac exam reveals S1 and S2. There is a crescendo decrescendo systolic murmur. The abdomen is soft. There is slight swelling in her left ankle. There is no edema in her right leg.  Filed Vitals:   09/22/13 1407  BP: 108/60  Pulse: 61  Height: 5\' 4"  (1.626 m)  SpO2: 97%     ASSESSMENT & PLAN

## 2013-09-22 NOTE — Assessment & Plan Note (Signed)
Patient has moderate aortic stenosis. She does not need an echo at this time.

## 2013-10-20 ENCOUNTER — Ambulatory Visit (INDEPENDENT_AMBULATORY_CARE_PROVIDER_SITE_OTHER): Payer: Medicare Other | Admitting: *Deleted

## 2013-10-20 DIAGNOSIS — I4891 Unspecified atrial fibrillation: Secondary | ICD-10-CM

## 2013-10-20 LAB — MDC_IDC_ENUM_SESS_TYPE_INCLINIC
Battery Remaining Longevity: 115 mo
Battery Voltage: 2.8 V
Brady Statistic AP VP Percent: 66 %
Brady Statistic AS VP Percent: 10 %
Lead Channel Impedance Value: 436 Ohm
Lead Channel Pacing Threshold Amplitude: 0.75 V
Lead Channel Setting Pacing Amplitude: 2 V
Lead Channel Setting Pacing Amplitude: 2.5 V
Lead Channel Setting Sensing Sensitivity: 2 mV
MDC IDC MSMT BATTERY IMPEDANCE: 202 Ohm
MDC IDC MSMT LEADCHNL RA SENSING INTR AMPL: 0.35 mV
MDC IDC MSMT LEADCHNL RV IMPEDANCE VALUE: 573 Ohm
MDC IDC MSMT LEADCHNL RV PACING THRESHOLD PULSEWIDTH: 0.4 ms
MDC IDC MSMT LEADCHNL RV SENSING INTR AMPL: 11.2 mV
MDC IDC SESS DTM: 20150119145128
MDC IDC SET LEADCHNL RV PACING PULSEWIDTH: 0.4 ms
MDC IDC STAT BRADY AP VS PERCENT: 0 %
MDC IDC STAT BRADY AS VS PERCENT: 24 %

## 2013-10-20 NOTE — Progress Notes (Signed)
Pacemaker check in clinic. Normal device function. Battery longevity 9.5 years. Pt in AF 40.4% of time. + ASA only. [place]

## 2013-10-24 ENCOUNTER — Encounter: Payer: Self-pay | Admitting: Cardiology

## 2013-10-26 ENCOUNTER — Encounter: Payer: Self-pay | Admitting: Cardiology

## 2013-10-31 ENCOUNTER — Encounter: Payer: Self-pay | Admitting: Internal Medicine

## 2014-03-16 ENCOUNTER — Ambulatory Visit (INDEPENDENT_AMBULATORY_CARE_PROVIDER_SITE_OTHER): Payer: Medicare Other | Admitting: Cardiology

## 2014-03-16 ENCOUNTER — Encounter: Payer: Self-pay | Admitting: Cardiology

## 2014-03-16 VITALS — BP 99/57 | HR 60 | Ht 64.0 in

## 2014-03-16 DIAGNOSIS — I2510 Atherosclerotic heart disease of native coronary artery without angina pectoris: Secondary | ICD-10-CM

## 2014-03-16 DIAGNOSIS — I5032 Chronic diastolic (congestive) heart failure: Secondary | ICD-10-CM

## 2014-03-16 DIAGNOSIS — I4891 Unspecified atrial fibrillation: Secondary | ICD-10-CM

## 2014-03-16 DIAGNOSIS — I350 Nonrheumatic aortic (valve) stenosis: Secondary | ICD-10-CM

## 2014-03-16 DIAGNOSIS — I359 Nonrheumatic aortic valve disorder, unspecified: Secondary | ICD-10-CM

## 2014-03-16 DIAGNOSIS — I10 Essential (primary) hypertension: Secondary | ICD-10-CM

## 2014-03-16 DIAGNOSIS — I509 Heart failure, unspecified: Secondary | ICD-10-CM

## 2014-03-16 MED ORDER — AMLODIPINE BESYLATE 2.5 MG PO TABS: 2.5000 mg | ORAL_TABLET | Freq: Every day | ORAL | Status: AC

## 2014-03-16 NOTE — Assessment & Plan Note (Signed)
The patient is not anticoagulated because of fall risk. Her rhythm is paced. She does not appear to have tachycardia at this time.

## 2014-03-16 NOTE — Patient Instructions (Signed)
Your physician recommends that you schedule a follow-up appointment in: 6 months. You will receive a reminder letter in the mail in about 4 months reminding you to call and schedule your appointment. If you don't receive this letter, please contact our office. Your physician has recommended you make the following change in your medication:  Decrease your amlodipine to 2.5 mg daily.  If your BP systolic number is <110 on this new dose, stop amlodipine.

## 2014-03-16 NOTE — Assessment & Plan Note (Signed)
Today the patient's blood pressure is relatively low. This may be due to compliance and other factors in the rehabilitation center. I have decreased her amlodipine from 5 mg to 2.5 mg daily. If her systolic remains below 110, her amlodipine will be stopped.

## 2014-03-16 NOTE — Assessment & Plan Note (Signed)
It is known that the patient has moderate aortic stenosis by echo in 2014. There is no indication to repeat an echo at this time.

## 2014-03-16 NOTE — Progress Notes (Signed)
Patient ID: [name], female   DOB: 10/31/1926, 80 y.o.   MRN: [id number]    HPI  Patient is seen today to followup her overall cardiac status. I saw her last December, 2014. [facility]. She had pneumonia at that time. She is now in a rehabilitation center. They keep her feet elevated and her edema is significantly improved. She has dementia. She's not having any obvious chest pain or shortness of breath. She is here with a family member.  Allergies  Allergen Reactions  . Sulfa Antibiotics Swelling    Current Outpatient Prescriptions  Medication Sig Dispense Refill  . acetaminophen (TYLENOL) 500 MG tablet Take 500 mg by mouth every 6 (six) hours as needed.      Marland Kitchen. aluminum-magnesium hydroxide-simethicone (MAALOX) 200-200-20 MG/5ML SUSP Take 30 mLs by mouth 4 (four) times daily -  before meals and at bedtime.      Marland Kitchen. amLODipine (NORVASC) 5 MG tablet Take 5 mg by mouth daily.      Marland Kitchen. aspirin EC 81 MG tablet Take 81 mg by mouth daily.      Marland Kitchen. atorvastatin (LIPITOR) 10 MG tablet Take 10 mg by mouth daily.      . benztropine (COGENTIN) 1 MG tablet Take 1 mg by mouth daily.      . beta carotene w/minerals (OCUVITE) tablet Take 1 tablet by mouth daily.      . clobetasol ointment (TEMOVATE) 0.05 % Apply 1 application topically 2 (two) times daily.      . cloNIDine (CATAPRES) 0.1 MG tablet Take 0.1 mg by mouth 2 (two) times daily.      . diphenhydrAMINE (BENADRYL) 25 mg capsule Take 25 mg by mouth every 6 (six) hours as needed.      . docusate sodium (COLACE) 100 MG capsule Take 100 mg by mouth 2 (two) times daily.      . Fluticasone-Salmeterol (ADVAIR) 250-50 MCG/DOSE AEPB Inhale 1 puff into the lungs every 12 (twelve) hours.        . furosemide (LASIX) 80 MG tablet Take 1 tablet (80 mg total) by mouth 2 (two) times daily.  180 tablet  3  . guaiFENesin (ROBITUSSIN) 100 MG/5ML liquid Take 200 mg by mouth 3 (three) times daily as needed for cough.      .  insulin aspart (NOVOLOG) 100 UNIT/ML injection Inject into the skin 2 (two) times daily.      . insulin detemir (LEVEMIR FLEXPEN) 100 UNIT/ML injection Inject 25 Units into the skin at bedtime.       Marland Kitchen. loperamide (IMODIUM) 2 MG capsule Take 2 mg by mouth as needed for diarrhea or loose stools.      Marland Kitchen. LORazepam (ATIVAN) 2 MG/ML concentrated solution Take 1 mg by mouth every 6 (six) hours as needed.      Marland Kitchen. losartan (COZAAR) 100 MG tablet Take 100 mg by mouth daily.      . magnesium hydroxide (MILK OF MAGNESIA) 400 MG/5ML suspension Take 30 mLs by mouth daily as needed for mild constipation.      . metFORMIN (GLUCOPHAGE) 500 MG tablet Take 500 mg by mouth 2 (two) times daily.      Marland Kitchen. OLANZapine (ZYPREXA) 5 MG tablet Take 5 mg by mouth 2 (two) times daily.      . pantoprazole (PROTONIX) 40 MG tablet Take 40 mg by mouth daily.       . potassium chloride (K-DUR,KLOR-CON) 10 MEQ tablet Take 10 mEq by  mouth daily.      . rivastigmine (EXELON) 4.6 mg/24hr Place 1 patch onto the skin daily.      Marland Kitchen. senna (SENOKOT) 8.6 MG tablet Take 2 tablets by mouth daily.      . sertraline (ZOLOFT) 25 MG tablet Take 25 mg by mouth daily.        Marland Kitchen. tiotropium (SPIRIVA) 18 MCG inhalation capsule Place 18 mcg into inhaler and inhale daily.      . traMADol (ULTRAM) 50 MG tablet Take 50 mg by mouth every 8 (eight) hours as needed.      . traZODone (DESYREL) 50 MG tablet Take 25 mg by mouth at bedtime.       No current facility-administered medications for this visit.    History   Social History  . Marital Status: Widowed    Spouse Name: N/A    Number of Children: N/A  . Years of Education: N/A   Occupational History  . Retired    Social History Main Topics  . Smoking status: Former Smoker -- 0.50 packs/day for 30 years    Types: Cigarettes    Quit date: 10/02/1984  . Smokeless tobacco: Never Used  . Alcohol Use: No  . Drug Use: No  . Sexual Activity: No   Other Topics Concern  . Not on file   Social  History Narrative   [place] in [place].   Has 5 children   One son lives next door to her.   Has a daughter that goes to store for groceries and picks up prescriptions.    Family History  Problem Relation Age of Onset  . Cancer Mother     Stomach  . Cancer Father     Stomach  . Heart disease Neg Hx     Nor sudden cardiac death  . Diabetes Other     Several family members  . Diabetes Sister   . Diabetes Brother   . Diabetes Sister     Past Medical History  Diagnosis Date  . COPD (chronic obstructive pulmonary disease)   . Essential hypertension, benign   . Hyperlipidemia   . Benign positional vertigo   . History of shingles   . Type 2 diabetes mellitus   . Osteoporosis   . Second degree Mobitz II AV block     s/p PPM 11/02/09  . Near syncope 09/09/2009  . Asthma   . Shortness of breath   . Dementia   . Paranoia   . Chronic pruritus   . Urinary tract infection   . Pacemaker     Dr. Johney FrameAllred follows, second-degree AV block  . Ejection fraction     EF 65%, echo, 2011  //  EF 60-65%, echo, March, 2014  . Carotid bruit     Dopplers, January, 2012, no hemodynamically significant stenosis of the carotid arteries  . Aortic stenosis     Moderate, echo, March, 2014, mean gradient 17 mm mercury  . Pulmonary hypertension     PA pressure 50-55 mmHg, echo, March, 2014  . CAD (coronary artery disease)     History of an MI 2010    Past Surgical History  Procedure Laterality Date  . Appendectomy    . Insert / replace / remove pacemaker  11/02/09    S/p Medtronic PPM - Dr. Johney FrameAllred  . Cholecystectomy    . Abdominal hysterectomy    . Eye surgery    . Total knee arthroplasty  1980's    Left  Patient Active Problem List   Diagnosis Date Noted  . Chronic diastolic CHF (congestive heart failure) 03/07/2013  . Aortic stenosis   . CAD (coronary artery disease)   . Carotid bruit   . Ejection fraction   . COPD (chronic obstructive pulmonary disease)   . Syncope   . Essential  hypertension, benign   . Benign positional vertigo   . Type 2 diabetes mellitus   . Pulmonary hypertension   . Pacemaker-Medtronic 08/13/2012  . Atrial fibrillation 06/26/2012  . UTI (lower urinary tract infection) 09/09/2011  . FTT (failure to thrive) in adult 09/09/2011  . Medically noncompliant 09/09/2011  . Dementia 09/09/2011  . Second degree Mobitz II AV block 05/15/2011  . DM 09/09/2009  . MIXED HYPERLIPIDEMIA 09/09/2009  . HYPERTENSION 09/09/2009  . Near syncope 09/09/2009    ROS   Patient is not able to give a reliable review of systems. There are no marked symptoms reported to me by the patient's family member.  PHYSICAL EXAM  Patient is in a wheelchair. She is overweight. Head is atraumatic. Sclera and conjunctiva are normal. There is no jugulovenous distention. Lungs are clear. Respiratory effort is not labored. Cardiac exam reveals S1 and S2. There is a systolic murmur. The abdomen is soft. She has no edema in the right lower extremity. There is trace edema in the left lower extremity.  Filed Vitals:   03/16/14 1330  BP: 99/57  Pulse: 60  Height: 5\' 4"  (1.626 m)  SpO2: 97%   EKG is done today and reviewed by me. The rhythm is paced.  ASSESSMENT & PLAN

## 2014-03-16 NOTE — Assessment & Plan Note (Signed)
Her volume status is actually pretty well controlled at this time. No change in therapy.

## 2014-03-26 ENCOUNTER — Encounter (INDEPENDENT_AMBULATORY_CARE_PROVIDER_SITE_OTHER): Payer: Self-pay

## 2014-05-15 ENCOUNTER — Encounter: Payer: Self-pay | Admitting: *Deleted

## 2014-06-19 ENCOUNTER — Ambulatory Visit (INDEPENDENT_AMBULATORY_CARE_PROVIDER_SITE_OTHER): Payer: Medicare Other | Admitting: Internal Medicine

## 2014-06-19 ENCOUNTER — Encounter: Payer: Self-pay | Admitting: Internal Medicine

## 2014-06-19 VITALS — BP 118/82 | HR 66 | Ht 63.0 in | Wt 222.0 lb

## 2014-06-19 DIAGNOSIS — I480 Paroxysmal atrial fibrillation: Secondary | ICD-10-CM

## 2014-06-19 DIAGNOSIS — I441 Atrioventricular block, second degree: Secondary | ICD-10-CM

## 2014-06-19 DIAGNOSIS — I2510 Atherosclerotic heart disease of native coronary artery without angina pectoris: Secondary | ICD-10-CM

## 2014-06-19 DIAGNOSIS — I4891 Unspecified atrial fibrillation: Secondary | ICD-10-CM

## 2014-06-19 DIAGNOSIS — I119 Hypertensive heart disease without heart failure: Secondary | ICD-10-CM | POA: Insufficient documentation

## 2014-06-19 LAB — MDC_IDC_ENUM_SESS_TYPE_INCLINIC
Battery Impedance: 226 Ohm
Battery Remaining Longevity: 103 mo
Battery Voltage: 2.8 V
Brady Statistic AP VP Percent: 33 %
Brady Statistic AP VS Percent: 0 %
Brady Statistic AS VS Percent: 59 %
Lead Channel Impedance Value: 573 Ohm
Lead Channel Pacing Threshold Amplitude: 1 V
Lead Channel Pacing Threshold Pulse Width: 0.4 ms
Lead Channel Setting Pacing Amplitude: 2 V
Lead Channel Setting Pacing Pulse Width: 0.4 ms
Lead Channel Setting Sensing Sensitivity: 4 mV
MDC IDC MSMT LEADCHNL RA IMPEDANCE VALUE: 425 Ohm
MDC IDC MSMT LEADCHNL RA SENSING INTR AMPL: 0.7 mV
MDC IDC MSMT LEADCHNL RV SENSING INTR AMPL: 11.2 mV
MDC IDC SESS DTM: 20150918123436
MDC IDC SET LEADCHNL RV PACING AMPLITUDE: 2.5 V
MDC IDC STAT BRADY AS VP PERCENT: 8 %

## 2014-06-19 NOTE — Patient Instructions (Signed)
Your physician recommends that you schedule a follow-up appointment in: 1 year with Dr. Johney Frame. You will receive a reminder letter in the mail in about 10 months reminding you to call and schedule your appointment. If you don't receive this letter, please contact our office. Your physician recommends that you schedule a follow-up appointment in: 6 months with Kristen in the pacer clinic. You will receive a reminder letter in the mail in about 4 months reminding you to call and schedule your appointment. If you don't receive this letter, please contact our office. Your physician recommends that you continue on your current medications as directed. Please refer to the Current Medication list given to you today.

## 2014-06-19 NOTE — Progress Notes (Signed)
PCP: Toma Deiters, MD Primary Cardiologist:  Dr Myrtis Ser   The patient presents today for routine electrophysiology followup.  The patient has become quite frail.  She is a nursing home resident.  Her daughter says that she is in bed all of the time.  She has occasional SOB.  She has chronic edema. Today, she denies symptoms of palpitations, chest pain,  dizziness, or neurologic sequela.  The patient feels that she is tolerating medications without difficulties and is otherwise without complaint today.   Past Medical History  Diagnosis Date  . COPD (chronic obstructive pulmonary disease)   . Essential hypertension, benign   . Hyperlipidemia   . Benign positional vertigo   . History of shingles   . Type 2 diabetes mellitus   . Osteoporosis   . Second degree Mobitz II AV block     s/p PPM 11/02/09  . Near syncope 09/09/2009  . Asthma   . Shortness of breath   . Dementia   . Paranoia   . Chronic pruritus   . Urinary tract infection   . Pacemaker     Dr. Johney Frame follows, second-degree AV block  . Ejection fraction     EF 65%, echo, 2011  //  EF 60-65%, echo, March, 2014  . Carotid bruit     Dopplers, January, 2012, no hemodynamically significant stenosis of the carotid arteries  . Aortic stenosis     Moderate, echo, March, 2014, mean gradient 17 mm mercury  . Pulmonary hypertension     PA pressure 50-55 mmHg, echo, March, 2014  . CAD (coronary artery disease)     History of an MI 2010   Past Surgical History  Procedure Laterality Date  . Appendectomy    . Insert / replace / remove pacemaker  11/02/09    S/p Medtronic PPM - Dr. Johney Frame  . Cholecystectomy    . Abdominal hysterectomy    . Eye surgery    . Total knee arthroplasty  1980's    Left    Current Outpatient Prescriptions  Medication Sig Dispense Refill  . albuterol (PROVENTIL) (2.5 MG/3ML) 0.083% nebulizer solution Take 2.5 mg by nebulization every 6 (six) hours as needed for wheezing or shortness of breath.      Marland Kitchen  amLODipine (NORVASC) 2.5 MG tablet Take 1 tablet (2.5 mg total) by mouth daily. If BP systolic <110 on this dose, D/C amlodipine.  180 tablet  3  . aspirin EC 81 MG tablet Take 81 mg by mouth daily.      Marland Kitchen atorvastatin (LIPITOR) 10 MG tablet Take 10 mg by mouth daily.      . benztropine (COGENTIN) 1 MG tablet Take 1 mg by mouth daily.      Marland Kitchen docusate sodium (COLACE) 100 MG capsule Take 100 mg by mouth 2 (two) times daily.      . Fluticasone-Salmeterol (ADVAIR) 250-50 MCG/DOSE AEPB Inhale 1 puff into the lungs every 12 (twelve) hours.        . furosemide (LASIX) 40 MG tablet Take 40 mg by mouth 2 (two) times daily.      . insulin aspart (NOVOLOG) 100 UNIT/ML injection Inject 4 Units into the skin 2 (two) times daily.       . insulin detemir (LEVEMIR FLEXPEN) 100 UNIT/ML injection Inject 15 Units into the skin 2 (two) times daily.       Marland Kitchen losartan (COZAAR) 100 MG tablet Take 100 mg by mouth daily.      . metFORMIN (GLUCOPHAGE) 500  MG tablet Take 500 mg by mouth 2 (two) times daily.      . mirtazapine (REMERON) 15 MG tablet Take 15 mg by mouth at bedtime.      . Multiple Vitamin (MULTIVITAMIN) tablet Take 1 tablet by mouth daily.      Marland Kitchen OLANZapine (ZYPREXA) 5 MG tablet Take 5 mg by mouth 2 (two) times daily.      Marland Kitchen omeprazole (PRILOSEC) 20 MG capsule Take 20 mg by mouth daily.      . OXYGEN Inhale 2 L into the lungs.      . potassium chloride (K-DUR,KLOR-CON) 10 MEQ tablet Take 10 mEq by mouth daily.      Marland Kitchen senna (SENOKOT) 8.6 MG tablet Take 1 tablet by mouth daily.       . sertraline (ZOLOFT) 25 MG tablet Take 25 mg by mouth daily.        Marland Kitchen tiotropium (SPIRIVA) 18 MCG inhalation capsule Place 18 mcg into inhaler and inhale daily.       No current facility-administered medications for this visit.    Allergies  Allergen Reactions  . Sulfa Antibiotics Swelling    History   Social History  . Marital Status: Widowed    Spouse Name: N/A    Number of Children: N/A  . Years of Education: N/A    Occupational History  . Retired    Social History Main Topics  . Smoking status: Former Smoker -- 0.50 packs/day for 30 years    Types: Cigarettes    Quit date: 10/02/1984  . Smokeless tobacco: Never Used  . Alcohol Use: No  . Drug Use: No  . Sexual Activity: No   Other Topics Concern  . Not on file   Social History Narrative   Lives in [place].   Has 5 children   One son lives next door to her.   Has a daughter that goes to store for groceries and picks up prescriptions.    Family History  Problem Relation Age of Onset  . Cancer Mother     Stomach  . Cancer Father     Stomach  . Heart disease Neg Hx     Nor sudden cardiac death  . Diabetes Other     Several family members  . Diabetes Sister   . Diabetes Brother   . Diabetes Sister      Physical Exam: Filed Vitals:   06/19/14 1201  BP: 118/82  Pulse: 66  Height:  (1.6 m)  Weight: 222 lb (100.699 kg)  SpO2: 94%    GEN- The patient is e,lderly and frail appearing, alert but seems confused Head- normocephalic, atraumatic Eyes-  Sclera clear, conjunctiva pink Ears- hearing intact Oropharynx- clear Neck- supple, no JVP Lungs- few basilar rales, normal work of breathing Chest- pacemaker pocket is well healed Heart- Regular rate and rhythm  GI- soft, NT, ND, + BS Extremities- no clubbing, cyanosis, + dependant edema MS- atrophy of R leg from prior polio  Pacemaker interrogation- reviewed in detail today,  See PACEART report  Assessment and Plan:  1. Mobitz II second degree AV block Normal pacemaker function See Pace Art report No changes today She appears to be in afib all of the time.  Will plan to switch to VVIR upon return to see Belenda Cruise in 6 months  2. Permanent afib Her chads2vasc score is at least 5.  She has not been previously anticoagulated due to falls.  I would suspect now that she is bed ridden  that she would be a candidate for eliquis.  I will defer this decision to Dr Myrtis Ser who  knows her well.  3. Hypertensive cardiovascular disease Stable No change required today  Enroll in [facility] Return to see me in 1 year Follow-up with Dr Myrtis Ser as scheduled

## 2014-07-03 ENCOUNTER — Encounter: Payer: Self-pay | Admitting: Internal Medicine

## 2014-09-14 ENCOUNTER — Encounter: Payer: Self-pay | Admitting: Cardiology

## 2014-09-14 ENCOUNTER — Ambulatory Visit (INDEPENDENT_AMBULATORY_CARE_PROVIDER_SITE_OTHER): Payer: Medicare Other | Admitting: Cardiology

## 2014-09-14 VITALS — BP 106/69 | HR 59 | Ht 63.0 in

## 2014-09-14 DIAGNOSIS — I5032 Chronic diastolic (congestive) heart failure: Secondary | ICD-10-CM

## 2014-09-14 DIAGNOSIS — I350 Nonrheumatic aortic (valve) stenosis: Secondary | ICD-10-CM

## 2014-09-14 DIAGNOSIS — I480 Paroxysmal atrial fibrillation: Secondary | ICD-10-CM

## 2014-09-14 DIAGNOSIS — I2510 Atherosclerotic heart disease of native coronary artery without angina pectoris: Secondary | ICD-10-CM

## 2014-09-14 MED ORDER — RIVAROXABAN 15 MG PO TABS: 15.0000 mg | ORAL_TABLET | Freq: Every day | ORAL | Status: DC

## 2014-09-14 NOTE — Assessment & Plan Note (Signed)
Her volume status is quite stable at this time. No change in therapy.

## 2014-09-14 NOTE — Progress Notes (Signed)
Patient ID: [name], female   DOB: 05/05/1927, 80 y.o.   MRN: [id number]    HPI  patient is seen today to follow-up  History of atrial fibrillation and moderate aortic stenosis. She also has mild chronic diastolic CHF. She's here from a nursing home. Her daughter is here with her. She is confused as to whether or not this is her daughter or her mother. She is not having any significant symptoms at this time.  Allergies  Allergen Reactions  . Sulfa Antibiotics Swelling    Current Outpatient Prescriptions  Medication Sig Dispense Refill  . albuterol (PROVENTIL) (2.5 MG/3ML) 0.083% nebulizer solution Take 2.5 mg by nebulization every 6 (six) hours as needed for wheezing or shortness of breath.    Marland Kitchen. amLODipine (NORVASC) 2.5 MG tablet Take 1 tablet (2.5 mg total) by mouth daily. If BP systolic <110 on this dose, D/C amlodipine. 180 tablet 3  . aspirin EC 81 MG tablet Take 81 mg by mouth daily.    Marland Kitchen. atorvastatin (LIPITOR) 10 MG tablet Take 10 mg by mouth daily.    . benztropine (COGENTIN) 1 MG tablet Take 1 mg by mouth daily.    Marland Kitchen. docusate sodium (COLACE) 100 MG capsule Take 100 mg by mouth 2 (two) times daily.    . Fluticasone-Salmeterol (ADVAIR) 250-50 MCG/DOSE AEPB Inhale 1 puff into the lungs every 12 (twelve) hours.      . furosemide (LASIX) 40 MG tablet Take 40 mg by mouth 2 (two) times daily.    . insulin aspart (NOVOLOG) 100 UNIT/ML injection Inject 4 Units into the skin 2 (two) times daily.     . insulin detemir (LEVEMIR FLEXPEN) 100 UNIT/ML injection Inject 15 Units into the skin 2 (two) times daily.     Marland Kitchen. losartan (COZAAR) 100 MG tablet Take 100 mg by mouth daily.    . metFORMIN (GLUCOPHAGE) 500 MG tablet Take 500 mg by mouth 2 (two) times daily.    . mirtazapine (REMERON) 15 MG tablet Take 15 mg by mouth at bedtime.    . Multiple Vitamin (MULTIVITAMIN) tablet Take 1 tablet by mouth daily.    Marland Kitchen. OLANZapine (ZYPREXA) 5 MG tablet Take 5 mg by mouth 2 (two) times daily.    Marland Kitchen.  omeprazole (PRILOSEC) 20 MG capsule Take 20 mg by mouth daily.    . OXYGEN Inhale 2 L into the lungs.    . potassium chloride (K-DUR,KLOR-CON) 10 MEQ tablet Take 10 mEq by mouth daily.    Marland Kitchen. senna (SENOKOT) 8.6 MG tablet Take 1 tablet by mouth daily.     . sertraline (ZOLOFT) 25 MG tablet Take 25 mg by mouth daily.      Marland Kitchen. tiotropium (SPIRIVA) 18 MCG inhalation capsule Place 18 mcg into inhaler and inhale daily.    . Rivaroxaban (XARELTO) 15 MG TABS tablet Take 1 tablet (15 mg total) by mouth daily. 30 tablet 6   No current facility-administered medications for this visit.    History   Social History  . Marital Status: Widowed    Spouse Name: N/A    Number of Children: N/A  . Years of Education: N/A   Occupational History  . Retired    Social History Main Topics  . Smoking status: Former Smoker -- 0.50 packs/day for 30 years    Types: Cigarettes    Start date: 03/04/1945    Quit date: 10/02/1984  . Smokeless tobacco: Never Used  . Alcohol Use: No  . Drug Use: No  .  Sexual Activity: No   Other Topics Concern  . Not on file   Social History Narrative   Lives in [place].   Has 5 children   One son lives next door to her.   Has a daughter that goes to store for groceries and picks up prescriptions.    Family History  Problem Relation Age of Onset  . Cancer Mother     Stomach  . Cancer Father     Stomach  . Heart disease Neg Hx     Nor sudden cardiac death  . Diabetes Other     Several family members  . Diabetes Sister   . Diabetes Brother   . Diabetes Sister     Past Medical History  Diagnosis Date  . COPD (chronic obstructive pulmonary disease)   . Essential hypertension, benign   . Hyperlipidemia   . Benign positional vertigo   . History of shingles   . Type 2 diabetes mellitus   . Osteoporosis   . Second degree Mobitz II AV block     s/p PPM 11/02/09  . Near syncope 09/09/2009  . Asthma   . Shortness of breath   . Dementia   . Paranoia   . Chronic  pruritus   . Urinary tract infection   . Pacemaker     Dr. Johney FrameAllred follows, second-degree AV block  . Ejection fraction     EF 65%, echo, 2011  //  EF 60-65%, echo, March, 2014  . Carotid bruit     Dopplers, January, 2012, no hemodynamically significant stenosis of the carotid arteries  . Aortic stenosis     Moderate, echo, March, 2014, mean gradient 17 mm mercury  . Pulmonary hypertension     PA pressure 50-55 mmHg, echo, March, 2014  . CAD (coronary artery disease)     History of an MI 2010    Past Surgical History  Procedure Laterality Date  . Appendectomy    . Insert / replace / remove pacemaker  11/02/09    S/p Medtronic PPM - Dr. Johney FrameAllred  . Cholecystectomy    . Abdominal hysterectomy    . Eye surgery    . Total knee arthroplasty  1980's    Left    Patient Active Problem List   Diagnosis Date Noted  . Hypertensive cardiovascular disease 06/19/2014  . Chronic diastolic CHF (congestive heart failure) 03/07/2013  . Aortic stenosis   . CAD (coronary artery disease)   . Carotid bruit   . Ejection fraction   . COPD (chronic obstructive pulmonary disease)   . Syncope   . Essential hypertension, benign   . Benign positional vertigo   . Type 2 diabetes mellitus   . Pulmonary hypertension   . Pacemaker-Medtronic 08/13/2012  . Atrial fibrillation 06/26/2012  . UTI (lower urinary tract infection) 09/09/2011  . FTT (failure to thrive) in adult 09/09/2011  . Medically noncompliant 09/09/2011  . Dementia 09/09/2011  . Second degree Mobitz II AV block 05/15/2011  . DM 09/09/2009  . MIXED HYPERLIPIDEMIA 09/09/2009  . HYPERTENSION 09/09/2009  . Near syncope 09/09/2009    ROS   patient denies fever, chills, headache, sweats, rash, change in vision, change in hearing, chest pain, cough, nausea or vomiting, urinary symptoms. All other systems are reviewed and are negative.  PHYSICAL EXAM  the patient is oriented to person. She is confused as to whether or not her daughter is  her daughter or her mother. There is a very meaningful relationship however between the  daughter and her mother. She wants all the best care possible for her. Head is atraumatic. Sclera and conjunctiva are normal. There is no jugular venous distention. Lungs are clear. Respiratory effort is nonlabored. Cardiac exam reveals S1 and S2. . The rate is controlled. The abdomen is soft. There is no peripheral edema.  Filed Vitals:   09/14/14 1458  BP: 106/69  Pulse: 59  Height: 5\' 3"  (1.6 m)  SpO2: 94%     ASSESSMENT & PLAN

## 2014-09-14 NOTE — Patient Instructions (Addendum)
Your physician recommends that you schedule a follow-up appointment in: 6 months. You will receive a reminder letter in the mail in about 4 months reminding you to call and schedule your appointment. If you don't receive this letter, please contact our office. Your physician has recommended you make the following change in your medication:  Start xarelto 15 mg daily or eliquis 2.5 mg daily depending on insurance. Continue all other medications the same.

## 2014-09-14 NOTE — Assessment & Plan Note (Signed)
She has mild to moderate aortic stenosis. She does not need a follow-up echo at this time.

## 2014-09-14 NOTE — Assessment & Plan Note (Signed)
The patient has atrial fibrillation that occurs 10% of the time when her pacemaker is interrogated. Historically she has not been anticoagulated because of fall risk. It is now well documented by the daughter that the patient is moved off from her bed to a chair. She has essentially no fall risk. Dr. Already had raised the question as to whether or not anticoagulation could now safely be used. I had an extensive discussion with the daughter about this. I explained all the pros and cons. I did not try to push the issue. The patient's daughter is very intent on the fact that she does not want the mother to have a stroke. After the full explanation she feels that she is not a fall risk. Therefore, anticoagulation will be started. Her renal function is adequate to support the use of either 15 mg of Xarelto daily or Eliquis 2.5 mg by mouth twice a day. We are  Recommending that one of these 2 medicines be started by the nursing home.

## 2014-09-15 ENCOUNTER — Encounter: Payer: Self-pay | Admitting: Cardiology

## 2014-12-31 ENCOUNTER — Encounter: Payer: Self-pay | Admitting: *Deleted

## 2015-01-18 ENCOUNTER — Telehealth: Payer: Self-pay | Admitting: Internal Medicine

## 2015-01-18 NOTE — Telephone Encounter (Signed)
Informed Kathie RhodesBetty from [facility] that pt was due for 6 month f/u w/ device clinic in March. Kathie RhodesBetty agreed to appt on 5-20 at 2:00PM

## 2015-01-18 NOTE — Telephone Encounter (Signed)
[facility] Kathie Rhodes(Betty) called today in reference to a letter from the Device clinic.  Please advise if she needs to be seen before September. Please call [phone] and [facility]

## 2015-02-18 ENCOUNTER — Ambulatory Visit (INDEPENDENT_AMBULATORY_CARE_PROVIDER_SITE_OTHER): Payer: Medicare Other | Admitting: *Deleted

## 2015-02-18 DIAGNOSIS — I441 Atrioventricular block, second degree: Secondary | ICD-10-CM | POA: Diagnosis not present

## 2015-02-18 DIAGNOSIS — I4819 Other persistent atrial fibrillation: Secondary | ICD-10-CM

## 2015-02-18 DIAGNOSIS — I481 Persistent atrial fibrillation: Secondary | ICD-10-CM

## 2015-02-18 LAB — CUP PACEART INCLINIC DEVICE CHECK
Battery Impedance: 298 Ohm
Battery Remaining Longevity: 95 mo
Brady Statistic AP VP Percent: 18 %
Brady Statistic AS VP Percent: 5 %
Brady Statistic AS VS Percent: 77 %
Lead Channel Impedance Value: 408 Ohm
Lead Channel Impedance Value: 555 Ohm
Lead Channel Pacing Threshold Amplitude: 0.75 V
Lead Channel Sensing Intrinsic Amplitude: 11.2 mV
Lead Channel Setting Sensing Sensitivity: 4 mV
MDC IDC MSMT BATTERY VOLTAGE: 2.79 V
MDC IDC MSMT LEADCHNL RA SENSING INTR AMPL: 0.35 mV
MDC IDC MSMT LEADCHNL RV PACING THRESHOLD PULSEWIDTH: 0.4 ms
MDC IDC SESS DTM: 20160519093325
MDC IDC SET LEADCHNL RA PACING AMPLITUDE: 2 V
MDC IDC SET LEADCHNL RV PACING AMPLITUDE: 2.5 V
MDC IDC SET LEADCHNL RV PACING PULSEWIDTH: 0.4 ms
MDC IDC STAT BRADY AP VS PERCENT: 0 %

## 2015-02-18 NOTE — Progress Notes (Signed)
Normal pacer check Battery longevity 8 years. AF 79.4% of time. + Eliquis No changes made. ROV 07-09-15 @ 930 with JA/[place].

## 2015-03-02 ENCOUNTER — Encounter: Payer: Self-pay | Admitting: Internal Medicine

## 2015-03-17 ENCOUNTER — Ambulatory Visit (INDEPENDENT_AMBULATORY_CARE_PROVIDER_SITE_OTHER): Payer: Medicare Other | Admitting: Cardiology

## 2015-03-17 ENCOUNTER — Encounter: Payer: Self-pay | Admitting: Cardiology

## 2015-03-17 VITALS — BP 113/69 | HR 75 | Ht 63.0 in | Wt 168.0 lb

## 2015-03-17 DIAGNOSIS — Z950 Presence of cardiac pacemaker: Secondary | ICD-10-CM

## 2015-03-17 DIAGNOSIS — I5032 Chronic diastolic (congestive) heart failure: Secondary | ICD-10-CM

## 2015-03-17 DIAGNOSIS — I4891 Unspecified atrial fibrillation: Secondary | ICD-10-CM | POA: Diagnosis not present

## 2015-03-17 MED ORDER — FUROSEMIDE 40 MG PO TABS: 40.0000 mg | ORAL_TABLET | Freq: Two times a day (BID) | ORAL | Status: AC

## 2015-03-17 NOTE — Progress Notes (Signed)
Cardiology Office Note   Date:  03/17/2015   ID:  [name], DOB 01/18/1927, MRN [id number]  PCP:  Toma Deiters, MD  Cardiologist:  Willa Rough, MD   Chief Complaint  Patient presents with  . Appointment    Follow-up atrial fibrillation and chronic diastolic CHF      History of Present Illness: [name] is a 81 y.o. female who presents today from the nursing home to follow-up CHF. I had seen her last December, 2015. Her daughter is very attentive. When we talked at the last visit, the patient's daughter decided that she did want the patient to be on an oral anticoagulated. She felt that the patient was a low fall risk. She felt strongly that she did not want to risk the patient having a stroke. She is on Eliquis 2.5 twice a day.  Patient says she is somewhat short of breath today.   Past Medical History  Diagnosis Date  . COPD (chronic obstructive pulmonary disease)   . Essential hypertension, benign   . Hyperlipidemia   . Benign positional vertigo   . History of shingles   . Type 2 diabetes mellitus   . Osteoporosis   . Second degree Mobitz II AV block     s/p PPM 11/02/09  . Near syncope 09/09/2009  . Asthma   . Shortness of breath   . Dementia   . Paranoia   . Chronic pruritus   . Urinary tract infection   . Pacemaker     Dr. Johney Frame follows, second-degree AV block  . Ejection fraction     EF 65%, echo, 2011  //  EF 60-65%, echo, March, 2014  . Carotid bruit     Dopplers, January, 2012, no hemodynamically significant stenosis of the carotid arteries  . Aortic stenosis     Moderate, echo, March, 2014, mean gradient 17 mm mercury  . Pulmonary hypertension     PA pressure 50-55 mmHg, echo, March, 2014  . CAD (coronary artery disease)     History of an MI 2010    Past Surgical History  Procedure Laterality Date  . Appendectomy    . Insert / replace / remove pacemaker  11/02/09    S/p Medtronic PPM - Dr. Johney Frame  . Cholecystectomy    . Abdominal  hysterectomy    . Eye surgery    . Total knee arthroplasty  1980's    Left    Patient Active Problem List   Diagnosis Date Noted  . Hypertensive cardiovascular disease 06/19/2014  . Chronic diastolic CHF (congestive heart failure) 03/07/2013  . Aortic stenosis   . CAD (coronary artery disease)   . Carotid bruit   . Ejection fraction   . COPD (chronic obstructive pulmonary disease)   . Syncope   . Essential hypertension, benign   . Benign positional vertigo   . Type 2 diabetes mellitus   . Pulmonary hypertension   . Pacemaker-Medtronic 08/13/2012  . Atrial fibrillation 06/26/2012  . UTI (lower urinary tract infection) 09/09/2011  . FTT (failure to thrive) in adult 09/09/2011  . Medically noncompliant 09/09/2011  . Dementia 09/09/2011  . Second degree Mobitz II AV block 05/15/2011  . DM 09/09/2009  . MIXED HYPERLIPIDEMIA 09/09/2009  . HYPERTENSION 09/09/2009  . Near syncope 09/09/2009      Current Outpatient Prescriptions  Medication Sig Dispense Refill  . albuterol (PROVENTIL) (2.5 MG/3ML) 0.083% nebulizer solution Take 2.5 mg by nebulization every 6 (six) hours as needed for wheezing  or shortness of breath.    Marland Kitchen amLODipine (NORVASC) 2.5 MG tablet Take 1 tablet (2.5 mg total) by mouth daily. If BP systolic <110 on this dose, D/C amlodipine. 180 tablet 3  . atorvastatin (LIPITOR) 10 MG tablet Take 10 mg by mouth daily.    . benztropine (COGENTIN) 1 MG tablet Take 1 mg by mouth daily.    Marland Kitchen docusate sodium (COLACE) 100 MG capsule Take 100 mg by mouth 2 (two) times daily.    . furosemide (LASIX) 40 MG tablet Take 40 mg by mouth 2 (two) times daily.    . insulin aspart (NOVOLOG) 100 UNIT/ML injection Inject 4 Units into the skin 2 (two) times daily.     . insulin detemir (LEVEMIR FLEXPEN) 100 UNIT/ML injection Inject 15 Units into the skin 2 (two) times daily.     Marland Kitchen losartan (COZAAR) 100 MG tablet Take 100 mg by mouth daily.    . metFORMIN (GLUCOPHAGE) 500 MG tablet Take 500 mg  by mouth 2 (two) times daily.    . mirtazapine (REMERON) 15 MG tablet Take 15 mg by mouth at bedtime.    . Multiple Vitamin (MULTIVITAMIN) tablet Take 1 tablet by mouth daily.    Marland Kitchen OLANZapine (ZYPREXA) 2.5 MG tablet Take 2.5 mg by mouth at bedtime.    Marland Kitchen omeprazole (PRILOSEC) 20 MG capsule Take 20 mg by mouth daily.    . OXYGEN Inhale 2 L into the lungs as needed.     . potassium chloride (K-DUR,KLOR-CON) 10 MEQ tablet Take 10 mEq by mouth daily.    Marland Kitchen senna (SENOKOT) 8.6 MG tablet Take 1 tablet by mouth daily.     . sertraline (ZOLOFT) 25 MG tablet Take 25 mg by mouth daily.       No current facility-administered medications for this visit.    Allergies:   Sulfa antibiotics    Social History:  The patient  reports that she quit smoking about 30 years ago. Her smoking use included Cigarettes. She started smoking about 70 years ago. She has a 15 pack-year smoking history. She has never used smokeless tobacco. She reports that she does not drink alcohol or use illicit drugs.   Family History:  The patient's family history includes Cancer in her father and mother; Diabetes in her brother, other, sister, and sister. There is no history of Heart disease.    ROS:  Please see the history of present illness.   The review of systems is taken with the help of the patient's daughter. There is no fever, chills, headache, sweats, rash, change in vision, change in hearing, chest pain, cough, nausea or vomiting, urinary symptoms. All other systems are reviewed and are negative.     PHYSICAL EXAM: VS:  BP 113/69 mmHg  Pulse 75  Ht  (1.6 m)  Wt 168 lb (76.204 kg)  BMI 29.77 kg/m2  SpO2 89% , The patient is in a wheelchair. She makes comments to her daughter which appear to show that she is not oriented. Head is atraumatic. Sclera and conjunctiva are normal. There is no jugular venous distention. Lungs reveal a few scattered rales. There are decreased breath sounds. There is no respiratory distress.  Cardiac exam reveals S1 and S2. Abdomen is soft. There is trace edema in the right ankle. She has 1-2+ edema in the left lower extremity.  EKG:    EKG is done today. There is pacing.  Recent Labs: No results found for requested labs within last 365 days.  Lipid Panel No results found for: CHOL, TRIG, HDL, CHOLHDL, VLDL, LDLCALC, LDLDIRECT    Wt Readings from Last 3 Encounters:  03/17/15 168 lb (76.204 kg)  06/19/14 222 lb (100.699 kg)  06/13/13 224 lb (101.606 kg)      Current medicines are reviewed  Medications are overseen to the nursing home.     ASSESSMENT AND PLAN:

## 2015-03-17 NOTE — Assessment & Plan Note (Signed)
Patient has a pacemaker in place

## 2015-03-17 NOTE — Assessment & Plan Note (Signed)
Patient has underlying atrial fib. Decision was made to use an anticoagulated in December, 2015. The patient's daughter clearly stated that she felt the patient was not a fall risk. The daughter wanted to do everything possible to prevent a stroke. She is on a small dose of an anticoagulated.

## 2015-03-17 NOTE — Patient Instructions (Signed)
Your physician has recommended you make the following change in your medication:  Please take furosemide 80 mg in the morning and 40 mg in the evening for 3 days; then, resume 40 mg twice daily. Continue all other medications the same. Your physician recommends that you schedule a follow-up appointment in: 6 months. You will receive a reminder letter in the mail in about 4 months reminding you to call and schedule your appointment. If you don't receive this letter, please contact our office.

## 2015-03-17 NOTE — Assessment & Plan Note (Signed)
The patient has a few rales today and increased edema. I've increased her diuretics dose for 3 days.

## 2015-03-31 ENCOUNTER — Telehealth: Payer: Self-pay | Admitting: Cardiology

## 2015-03-31 NOTE — Telephone Encounter (Signed)
Angie from [facility] called in regards to patient's BMP   Results [phone]  (they are faxing copy to our office) patient is on oxygen now / Complaints of shotness of breath.   Please call Angie @ [facility] [phone]  [address] hall nurse  Lasix 40 b.i.d.

## 2015-04-08 NOTE — Telephone Encounter (Signed)
Lab work was never received. Made an attempt to call nurse at [facility] back about this patient.

## 2015-04-12 NOTE — Telephone Encounter (Signed)
Sp[facility] with [facility] at [facility] and she said that patient had pneumonia and was treated by [facility] and is doing much better.

## 2015-04-30 ENCOUNTER — Encounter: Payer: Self-pay | Admitting: Internal Medicine

## 2015-06-03 DEATH — deceased

## 2015-07-09 ENCOUNTER — Encounter: Payer: Medicare Other | Admitting: Internal Medicine

## 2015-07-16 ENCOUNTER — Encounter: Payer: Medicare Other | Admitting: Internal Medicine
